# Patient Record
Sex: Female | Born: 2000
Health system: Southern US, Community
[De-identification: ages and names within clinical notes are randomized; demographics above are authoritative.]

## PROBLEM LIST (undated history)

## (undated) DIAGNOSIS — R51 Headache: Secondary | ICD-10-CM

## (undated) DIAGNOSIS — R519 Headache, unspecified: Secondary | ICD-10-CM

## (undated) DIAGNOSIS — F1910 Other psychoactive substance abuse, uncomplicated: Secondary | ICD-10-CM

## (undated) DIAGNOSIS — J40 Bronchitis, not specified as acute or chronic: Secondary | ICD-10-CM

## (undated) DIAGNOSIS — F32A Depression, unspecified: Secondary | ICD-10-CM

## (undated) DIAGNOSIS — F329 Major depressive disorder, single episode, unspecified: Secondary | ICD-10-CM

## (undated) DIAGNOSIS — F319 Bipolar disorder, unspecified: Secondary | ICD-10-CM

## (undated) DIAGNOSIS — O021 Missed abortion: Secondary | ICD-10-CM

## (undated) DIAGNOSIS — M6752 Plica syndrome, left knee: Secondary | ICD-10-CM

## (undated) DIAGNOSIS — R000 Tachycardia, unspecified: Secondary | ICD-10-CM

## (undated) DIAGNOSIS — J189 Pneumonia, unspecified organism: Secondary | ICD-10-CM

## (undated) DIAGNOSIS — F419 Anxiety disorder, unspecified: Secondary | ICD-10-CM

## (undated) HISTORY — DX: Tachycardia, unspecified: R00.0

---
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
## 2019-01-03 ENCOUNTER — Ambulatory Visit: Payer: Managed Care, Other (non HMO) | Admitting: Neurology

## 2019-01-03 ENCOUNTER — Other Ambulatory Visit: Payer: Self-pay

## 2019-01-03 ENCOUNTER — Encounter: Payer: Self-pay | Admitting: Neurology

## 2019-01-03 VITALS — BP 100/70 | HR 115 | Temp 97.7°F | Ht 65.0 in | Wt 168.6 lb

## 2019-01-03 DIAGNOSIS — R635 Abnormal weight gain: Secondary | ICD-10-CM

## 2019-01-03 DIAGNOSIS — F1911 Other psychoactive substance abuse, in remission: Secondary | ICD-10-CM | POA: Diagnosis not present

## 2019-01-03 DIAGNOSIS — G43009 Migraine without aura, not intractable, without status migrainosus: Secondary | ICD-10-CM | POA: Diagnosis not present

## 2019-01-03 MED ORDER — TOPIRAMATE 50 MG PO TABS: ORAL_TABLET | ORAL | 11 refills | Status: DC

## 2019-01-03 MED ORDER — SUMATRIPTAN SUCCINATE 100 MG PO TABS: 100.0000 mg | ORAL_TABLET | Freq: Once | ORAL | 2 refills | Status: DC | PRN

## 2019-01-03 NOTE — Progress Notes (Signed)
[facility]  PATIENT: Jenny Clark DOB: 06/16/2000  REFERRING DOCTOR OR PCP: Eveline Keto, NP (OB/GYN) and Marva Panda, NP (PCP) SOURCE:, Notes from other doctors.  _________________________________   HISTORICAL  CHIEF COMPLAINT:  Chief Complaint  Patient presents with  . New Patient (Initial Visit)    RM 13, alone. Paper referral for headaches from Eveline Keto, NP. Headaches started this past year, pain behind eyes. When headaches get worse, feels like she has to pass out.     HISTORY OF PRESENT ILLNESS:  I had the pleasure of seeing your patient, Jenny Clark, at [facility] for neurologic consultation regarding her headaches.  She is a 18 year old woman who has had migraine headaches over the last year.   They were less frequent at the beginning of the year and a few times a month during August and September and now about 2-3 days a week.      When a headache occurs, she notes pain behind the right eye. Quality of the pain is throbbing when intense superimosed on a more steady pressure like pain.  She gets nausea but no vomiting.   sometimes she is lightheaded and her body shakes.    Bright light, noises and movements intensifies the pain.    She has a milder constant pressure like pain behind the left eye that is more constant but less troublesome.    She has had blurry vision and poor focus with he migraines but no classic aura.     She has taken NSAIDs.   If she takes as soon as pain starts or when mild she has a benefit but if pain is more intense she has no benefit.   She has not tried prescribed medications.   No prophylactic medications.     Her brother gets some headaches but not typical migraines.   She had no problems with headaches when younger.      Her only medication is OCP and vitamins.   She has been on medications for bipolar disease in the past.    She has a history of substance abuse.  She notes some weight gain  over the past year.  REVIEW OF SYSTEMS: Constitutional: No fevers, chills, sweats, or change in appetite Eyes: No visual changes, double vision, eye pain Ear, nose and throat: No hearing loss, ear pain, nasal congestion, sore throat Cardiovascular: No chest pain, palpitations Respiratory: No shortness of breath at rest or with exertion.   No wheezes GastrointestinaI: No nausea, vomiting, diarrhea, abdominal pain, fecal incontinence Genitourinary: No dysuria, urinary retention or frequency.  No nocturia. Musculoskeletal: No neck pain, back pain Integumentary: No rash, pruritus, skin lesions Neurological: as above Psychiatric: She was diagnosed with bipolar disease in the past.  She feels mood issues are doing better. Endocrine: No palpitations, diaphoresis, change in appetite, change in weigh or increased thirst Hematologic/Lymphatic: No anemia, purpura, petechiae. Allergic/Immunologic: No itchy/runny eyes, nasal congestion, recent allergic reactions, rashes  ALLERGIES: No Known Allergies  HOME MEDICATIONS:  Current Outpatient Medications:  .  Norgestimate-Ethinyl Estradiol Triphasic (TRI-PREVIFEM) 0.18/0.215/0.25 MG-35 MCG tablet, Take 1 tablet by mouth daily., Disp: , Rfl:  .  SUMAtriptan (IMITREX) 100 MG tablet, Take 1 tablet (100 mg total) by mouth once as needed for up to 1 dose for migraine. May repeat in 2 hours if headache persists or recurs., Disp: 10 tablet, Rfl: 2 .  topiramate (TOPAMAX) 50 MG tablet, Take one or two at bedtime, Disp: 60 tablet, Rfl: 11  PAST MEDICAL HISTORY: Past Medical History:  Diagnosis Date  . Anxiety   . Bipolar disorder (HCC)    not fully diagnosed  . Bronchitis   . Depression   . Drug abuse (HCC)    meth, cocaine,molly, marijuana  . Headache    migraines  . Missed abortion 06/17/2017  . 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 Pneumonia    as a child    PAST SURGICAL HISTORY: Past Surgical History:  Procedure Laterality Date  .  DILATION AND EVACUATION N/A 06/19/2017   Procedure: DILATATION AND EVACUATION;  Surgeon: Sherian ReinBovard-Stuckert, Jody, MD;  Location: [facility];  Service: Gynecology;  Laterality: N/A;  . KNEE ARTHROSCOPY Left 01/23/2014   Procedure: LEFT KNEE SCOPE WITH PLICA EXCISION;  Surgeon: Thera FlakeW D Caffrey Jr., MD;  Location: [facility];  Service: Orthopedics;  Laterality: Left;  . WISDOM TOOTH EXTRACTION  01/10/2014    FAMILY HISTORY: Family History  Problem Relation Age of Onset  . Hypertension Maternal Grandfather   . Other Maternal Grandmother        cerebral amyloiod angiopathy    SOCIAL HISTORY:  Social History   Socioeconomic History  . Marital status: Single    Spouse name: Not on file  . Number of children: 0  . Years of education: 5614  . Highest education level: Not on file  Occupational History  . Occupation: [employer]  Tobacco Use  . Smoking status: Current Every Day Smoker    Packs/day: 0.50    Years: 4.00    Pack years: 2.00    Types: Cigarettes  . Smokeless tobacco: Never Used  Substance and Sexual Activity  . Alcohol use: Yes    Comment: occas  . Drug use: Not Currently    Types: Benzodiazepines, Cocaine, Amphetamines, Marijuana    Comment: denies use 04/25/18  . Sexual activity: Yes    Comment: offered condoms  Other Topics Concern  . Not on file  Social History Narrative   Lives with aunt   Caffeine use: daily   Right handed    Social Determinants of Health   Financial Resource Strain:   . Difficulty of Paying Living Expenses: Not on file  Food Insecurity:   . Worried About Programme researcher, broadcasting/film/videounning Out of Food in the Last Year: Not on file  . Ran Out of Food in the Last Year: Not on file  Transportation Needs:   . Lack of Transportation (Medical): Not on file  . Lack of Transportation (Non-Medical): Not on file  Physical Activity:   . Days of Exercise per Week: Not on file  . Minutes of Exercise per Session: Not on file  Stress:   . Feeling of Stress :  Not on file  Social Connections:   . Frequency of Communication with Friends and Family: Not on file  . Frequency of Social Gatherings with Friends and Family: Not on file  . Attends Religious Services: Not on file  . Active Member of Clubs or Organizations: Not on file  . Attends BankerClub or Organization Meetings: Not on file  . Marital Status: Not on file  Intimate Partner Violence:   . Fear of Current or Ex-Partner: Not on file  . Emotionally Abused: Not on file  . Physically Abused: Not on file  . Sexually Abused: Not on file     PHYSICAL EXAM  Vitals:   01/03/19 1021  BP: 100/70  Pulse: (!) 115  Temp: 97.7 F (36.5 C)  SpO2: 98%  Weight: 168 lb 9.6 oz (76.5 kg)  Height: 5\' 5"  (1.651 m)    Body mass index is 28.06 kg/m.   General: The patient is well-developed and well-nourished and in no acute distress  HEENT:  Head is [place]/AT.  Sclera are anicteric.  Funduscopic exam shows normal optic discs and retinal vessels.  Neck: No carotid bruits are noted.  The neck is minimally tender at the occiput.  Range of motion is normal..  Cardiovascular: The heart has a regular rate and rhythm with a normal S1 and S2. There were no murmurs, gallops or rubs.    Skin: Extremities are without rash or  edema.  Musculoskeletal:  Back is nontender  Neurologic Exam  Mental status: The patient is alert and oriented x 3 at the time of the examination. The patient has apparent normal recent and remote memory, with an apparently normal attention span and concentration ability.   Speech is normal.  Cranial nerves: Extraocular movements are full. Pupils are equal, round, and reactive to light and accomodation.  Visual fields are full.  Facial symmetry is present. There is good facial sensation to soft touch bilaterally.Facial strength is normal.  Trapezius and sternocleidomastoid strength is normal. No dysarthria is noted.  The tongue is midline, and the patient has symmetric elevation of the soft  palate. No obvious hearing deficits are noted.  Motor:  Muscle bulk is normal.   Tone is normal. Strength is  5 / 5 in all 4 extremities.   Sensory: Sensory testing is intact to pinprick, soft touch and vibration sensation in arms but reduced sensation to vibration in feet..  Coordination: Cerebellar testing reveals good finger-nose-finger and heel-to-shin bilaterally.  Gait and station: Station is normal.   Gait is normal. Tandem gait is slightly wide.   Romberg is negative.   Reflexes: Deep tendon reflexes are symmetric and normal bilaterally.   Plantar responses are flexor.    DIAGNOSTIC DATA (LABS, IMAGING, TESTING) - I reviewed patient records, labs, notes, testing and imaging myself where available.  Lab Results  Component Value Date   WBC 14.5 (H) 04/10/2018   HGB 12.5 04/10/2018   HCT 38.1 04/10/2018   MCV 90.5 04/10/2018   PLT 228 04/10/2018      Component Value Date/Time   NA 138 04/26/2018 0341   K 3.8 04/26/2018 0341   CL 104 04/26/2018 0341   CO2 24 04/26/2018 0341   GLUCOSE 111 (H) 04/26/2018 0341   BUN 13 04/26/2018 0341   CREATININE 0.68 04/26/2018 0341   CREATININE 0.73 01/04/2016 0001   CALCIUM 8.7 (L) 04/26/2018 0341   PROT 7.6 04/26/2018 0341   ALBUMIN 3.9 04/26/2018 0341   AST 21 04/26/2018 0341   ALT 17 04/26/2018 0341   ALKPHOS 91 04/26/2018 0341   BILITOT 0.2 (L) 04/26/2018 0341   GFRNONAA >60 04/26/2018 0341   GFRAA >60 04/26/2018 0341   Lab Results  Component Value Date   CHOL 174 04/12/2018   HDL 63 04/12/2018   LDLCALC 93 04/12/2018   TRIG 91 04/12/2018   CHOLHDL 2.8 04/12/2018   Lab Results  Component Value Date   HGBA1C 4.9 04/12/2018   No results found for: VITAMINB12 Lab Results  Component Value Date   TSH 1.726 04/12/2018       ASSESSMENT AND PLAN  History of substance abuse ([place])  Common migraine without intractability  Weight gain  In summary, Jenny Clark is a 18 year old woman with frequent migraine headaches  over the last year.  These are superimposed on a more constant milder pressure-like headache.  Because she is experiencing migraine pain 8 to 12 days a month, prophylactic treatment is recommended.  I discussed some options.  I will start Topamax 50 mg and have her titrate up to 100 mg if tolerated.  Of note, she had been on this a few years ago for bipolar disease and she feels that she had tolerated it well.  At that time, headaches had not been a problem.  Additionally we will start Imitrex.  We discussed limiting to no more than 10 pills a month.  She does not have any "red flag" on her examination though she does have reduced vibration sensation in the toes.  If this worsens we will need to check a nerve conduction study/EMG and some lab work.  No imaging is needed at this time.  If headaches worsen consider scanning.  If no benefit from the Topamax consider a different agent and if she fails a second oral agent we will consider one of the anti-CGRP injections.  Of note, she had been on Depakote for bipolar disease in the past but it caused side effects and was discontinued.  If she has trouble tolerating Imitrex or it is not effective consider one of the pills acting through CGRP.  She will see [name] in 3 months or sooner if there are new or worsening neurologic symptoms.  Thank you for asking me to see Jenny Clark.  Please let me know if I can be of further assistance with her or other patients in the future.    Islam Villescas A. Epimenio Foot, MD, [facility] 01/03/2019, 1:07 PM Certified in Neurology, Clinical Neurophysiology, Sleep Medicine and Neuroimaging  [facility] [address] [place], [place] [address] [phone]

## 2019-04-04 ENCOUNTER — Ambulatory Visit: Payer: Self-pay | Admitting: Family Medicine

## 2019-04-18 ENCOUNTER — Other Ambulatory Visit: Payer: Self-pay | Admitting: Neurology

## 2019-05-23 ENCOUNTER — Telehealth: Payer: Self-pay

## 2019-05-23 NOTE — Telephone Encounter (Signed)
Pt called office and left a VM requesting a call back to discuss her upcoming appt with Dr. Epimenio Foot.

## 2019-05-23 NOTE — Telephone Encounter (Signed)
I called pt. She asked to cancel her appt for 05/26/19. She will call us to reschedule. Appt has been cancelled.

## 2019-05-26 ENCOUNTER — Ambulatory Visit: Payer: Managed Care, Other (non HMO) | Admitting: Family Medicine

## 2019-10-26 ENCOUNTER — Encounter: Payer: Self-pay | Admitting: Psychiatry

## 2019-12-20 ENCOUNTER — Other Ambulatory Visit: Payer: Self-pay | Admitting: Neurology

## 2019-12-22 ENCOUNTER — Other Ambulatory Visit: Payer: Self-pay | Admitting: Neurology

## 2020-09-04 ENCOUNTER — Ambulatory Visit: Payer: Managed Care, Other (non HMO) | Admitting: Neurology

## 2020-10-08 LAB — HEPATITIS C ANTIBODY: HCV Ab: NEGATIVE

## 2020-10-08 LAB — OB RESULTS CONSOLE HIV ANTIBODY (ROUTINE TESTING): HIV: NONREACTIVE

## 2020-10-17 LAB — OB RESULTS CONSOLE ABO/RH

## 2020-11-06 DIAGNOSIS — Z3402 Encounter for supervision of normal first pregnancy, second trimester: Secondary | ICD-10-CM | POA: Diagnosis not present

## 2020-11-06 DIAGNOSIS — Z3482 Encounter for supervision of other normal pregnancy, second trimester: Secondary | ICD-10-CM | POA: Diagnosis not present

## 2020-11-06 DIAGNOSIS — Z3A13 13 weeks gestation of pregnancy: Secondary | ICD-10-CM | POA: Diagnosis not present

## 2020-11-06 DIAGNOSIS — R87612 Low grade squamous intraepithelial lesion on cytologic smear of cervix (LGSIL): Secondary | ICD-10-CM | POA: Diagnosis not present

## 2020-11-28 ENCOUNTER — Ambulatory Visit: Payer: Managed Care, Other (non HMO) | Admitting: Neurology

## 2020-12-11 ENCOUNTER — Inpatient Hospital Stay (HOSPITAL_COMMUNITY)
Admission: AD | Admit: 2020-12-11 | Discharge: 2020-12-11 | Disposition: A | Discharge status: Home / Self Care | Payer: Managed Care, Other (non HMO) | Attending: Obstetrics and Gynecology | Admitting: Obstetrics and Gynecology

## 2020-12-11 ENCOUNTER — Other Ambulatory Visit: Payer: Self-pay | Admitting: Advanced Practice Midwife

## 2020-12-11 ENCOUNTER — Other Ambulatory Visit: Payer: Self-pay

## 2020-12-11 ENCOUNTER — Encounter (HOSPITAL_COMMUNITY): Payer: Self-pay | Admitting: Obstetrics and Gynecology

## 2020-12-11 DIAGNOSIS — Z3492 Encounter for supervision of normal pregnancy, unspecified, second trimester: Secondary | ICD-10-CM

## 2020-12-11 DIAGNOSIS — O99891 Other specified diseases and conditions complicating pregnancy: Secondary | ICD-10-CM

## 2020-12-11 DIAGNOSIS — R42 Dizziness and giddiness: Secondary | ICD-10-CM | POA: Insufficient documentation

## 2020-12-11 DIAGNOSIS — F17210 Nicotine dependence, cigarettes, uncomplicated: Secondary | ICD-10-CM | POA: Insufficient documentation

## 2020-12-11 DIAGNOSIS — R000 Tachycardia, unspecified: Secondary | ICD-10-CM | POA: Diagnosis not present

## 2020-12-11 DIAGNOSIS — O99332 Smoking (tobacco) complicating pregnancy, second trimester: Secondary | ICD-10-CM | POA: Insufficient documentation

## 2020-12-11 DIAGNOSIS — O26892 Other specified pregnancy related conditions, second trimester: Secondary | ICD-10-CM | POA: Diagnosis present

## 2020-12-11 DIAGNOSIS — R55 Syncope and collapse: Secondary | ICD-10-CM | POA: Diagnosis not present

## 2020-12-11 DIAGNOSIS — Z3A18 18 weeks gestation of pregnancy: Secondary | ICD-10-CM | POA: Diagnosis not present

## 2020-12-11 LAB — RAPID URINE DRUG SCREEN, HOSP PERFORMED
Amphetamines: NOT DETECTED
Barbiturates: NOT DETECTED
Benzodiazepines: NOT DETECTED
Cocaine: NOT DETECTED
Opiates: NOT DETECTED
Tetrahydrocannabinol: NOT DETECTED

## 2020-12-11 LAB — COMPREHENSIVE METABOLIC PANEL
ALT: 22 U/L (ref 0–44)
AST: 20 U/L (ref 15–41)
Albumin: 3.2 g/dL — ABNORMAL LOW (ref 3.5–5.0)
Alkaline Phosphatase: 73 U/L (ref 38–126)
Anion gap: 10 (ref 5–15)
BUN: 6 mg/dL (ref 6–20)
CO2: 22 mmol/L (ref 22–32)
Calcium: 9.4 mg/dL (ref 8.9–10.3)
Chloride: 103 mmol/L (ref 98–111)
Creatinine, Ser: 0.52 mg/dL (ref 0.44–1.00)
GFR, Estimated: >60 mL/min (ref >60)
Glucose, Bld: 74 mg/dL (ref 70–99)
Potassium: 3.8 mmol/L (ref 3.5–5.1)
Sodium: 135 mmol/L (ref 135–145)
Total Bilirubin: 0.2 mg/dL — ABNORMAL LOW (ref 0.3–1.2)
Total Protein: 7.3 g/dL (ref 6.5–8.1)

## 2020-12-11 LAB — CBC
HCT: 36.9 % (ref 36.0–46.0)
Hemoglobin: 12.3 g/dL (ref 12.0–15.0)
MCH: 28.5 pg (ref 26.0–34.0)
MCHC: 33.3 g/dL (ref 30.0–36.0)
MCV: 85.6 fL (ref 80.0–100.0)
Platelets: 251 10*3/uL (ref 150–400)
RBC: 4.31 MIL/uL (ref 3.87–5.11)
RDW: 13.3 % (ref 11.5–15.5)
WBC: 13.4 10*3/uL — ABNORMAL HIGH (ref 4.0–10.5)
nRBC: 0 % (ref 0.0–0.2)

## 2020-12-11 LAB — URINALYSIS, ROUTINE W REFLEX MICROSCOPIC
Bilirubin Urine: NEGATIVE
Glucose, UA: NEGATIVE mg/dL
Hgb urine dipstick: NEGATIVE
Ketones, ur: NEGATIVE mg/dL
Leukocytes,Ua: NEGATIVE
Nitrite: NEGATIVE
Protein, ur: NEGATIVE mg/dL
Specific Gravity, Urine: >1.03 — ABNORMAL HIGH (ref 1.005–1.030)
pH: 6 (ref 5.0–8.0)

## 2020-12-11 NOTE — Progress Notes (Signed)
Referral to [facility] as indicated by recurrent syncope.  Clayton Bibles, MSA, MSN, CNM Certified Nurse Midwife, Biochemist, clinical for [employer], [facility]

## 2020-12-11 NOTE — Progress Notes (Signed)
Sam Weinhold CNM in earlier to discuss test results and d/c plan. Written and verbal d/c instructions given and understanding voiced. 

## 2020-12-11 NOTE — MAU Provider Note (Addendum)
History     CSN: [id number]  Arrival date and time: 12/11/20 1752   Event Date/Time   First Provider Initiated Contact with Patient 12/11/20 2031      Chief Complaint  Patient presents with   visual changes   Tachycardia   Dizziness   HPI Jenny Clark is a 20 y.o. G2P0 at [redacted]w[redacted]d who presents to MAU with chief complaints of near syncope, tachycardia, activity intolerance and visual changes. These are recurrent problems which predate pregnancy. 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 This occurred immediately after vomiting. She states prior to this episode she lost control of her eyelid. She endorses "seeing stars" and feels as if her "heart is beating out of my chest". She experienced a similar episode yesterday and her symptoms resolved with rest. Patient is asymptomatic on arrival to MAU. 12 hour diet recall is significant for cereal and [employer].   Patient endorses history of migraines. She states that when her migraines were severe she would experience the inability to control one of her eyes, dizziness and syncope. She endorses ED evaluation of these symptoms but has not been evaluated by Cardiology and has never worn a cardiac monitor. She reports multiple episodes of today's symptoms in first trimester but states that period of her pregnancy was complicated by severe vomiting. Her syncopal and near-syncopal events in first trimester occurred in the setting of strong smells, patient care involving lifting (e.g. moving patient to [facility] lift) or bathing a patient in a hot room.  Patient receives prenatal care with [facility].  OB History     Gravida  2   Para      Term      Preterm      AB      Living         SAB      IAB      Ectopic      Multiple      Live Births              Past Medical History:  Diagnosis Date   Anxiety    Bipolar disorder (HCC)    not fully diagnosed   Bronchitis    Depression    Drug  abuse (HCC)    meth, cocaine,molly, marijuana   Headache    migraines   Missed abortion 06/17/2017   Plica syndrome of left knee 01/2014   Pneumonia    as a child    Past Surgical History:  Procedure Laterality Date   DILATION AND EVACUATION N/A 06/19/2017   Procedure: DILATATION AND EVACUATION;  Surgeon: Sherian Rein, MD;  Location: [facility];  Service: Gynecology;  Laterality: N/A;   KNEE ARTHROSCOPY Left 01/23/2014   Procedure: LEFT KNEE SCOPE WITH PLICA EXCISION;  Surgeon: Thera Flake., MD;  Location: [facility];  Service: Orthopedics;  Laterality: Left;   WISDOM TOOTH EXTRACTION  01/10/2014    Family History  Problem Relation Age of Onset   Hypertension Maternal Grandfather    Other Maternal Grandmother        cerebral amyloiod angiopathy    Social History   Tobacco Use   Smoking status: Every Day    Packs/day: 0.50    Years: 4.00    Pack years: 2.00    Types: Cigarettes   Smokeless tobacco: Never  Vaping Use   Vaping Use: Never used  Substance Use Topics   Alcohol use: Yes  Comment: occas   Drug use: Not Currently    Types: Benzodiazepines, Cocaine, Amphetamines, Marijuana    Comment: denies use 04/25/18    Allergies: No Known Allergies  Medications Prior to Admission  Medication Sig Dispense Refill Last Dose   Norgestimate-Ethinyl Estradiol Triphasic (TRI-PREVIFEM) 0.18/0.215/0.25 MG-35 MCG tablet Take 1 tablet by mouth daily.      SUMAtriptan (IMITREX) 100 MG tablet TAKE 1 TAB ONCE AS NEEDED FOR UP TO 1 DOSE FOR MIGRAINE. MAY REPEAT IN 2HRS IF HEADACHE PERSISTS 9 tablet 0    topiramate (TOPAMAX) 50 MG tablet Take one or two at bedtime 60 tablet 11     Review of Systems  Eyes:  Negative for photophobia and visual disturbance.  Respiratory:  Negative for chest tightness.   Cardiovascular:  Negative for palpitations.  Gastrointestinal:  Negative for abdominal pain.  Neurological:  Negative for dizziness and headaches.  All other  systems reviewed and are negative. Physical Exam   Blood pressure 123/67, pulse (!) 118, temperature 98.1 F (36.7 C), temperature source Oral, resp. rate 18, height 5\' 5"  (1.651 m), weight 86.8 kg, SpO2 99 %, unknown if currently breastfeeding.  Physical Exam Vitals and nursing note reviewed. Exam conducted with a chaperone present.  Constitutional:      General: She is not in acute distress.    Appearance: Normal appearance. She is not ill-appearing.  Cardiovascular:     Rate and Rhythm: Normal rate and regular rhythm.     Pulses: Normal pulses.     Heart sounds: Normal heart sounds.  Pulmonary:     Effort: Pulmonary effort is normal.     Breath sounds: Normal breath sounds.  Abdominal:     Palpations: Abdomen is soft.  Skin:    General: Skin is warm and dry.     Capillary Refill: Capillary refill takes less than 2 seconds.  Neurological:     Mental Status: She is alert and oriented to person, place, and time.     Cranial Nerves: Cranial nerves 2-12 are intact.     Sensory: Sensation is intact.     Motor: Motor function is intact.     Coordination: Coordination is intact.     Gait: Gait is intact.  Psychiatric:        Mood and Affect: Mood normal.        Behavior: Behavior normal.        Thought Content: Thought content normal.        Judgment: Judgment normal.    MAU Course  Procedures  --EMR reviewed. Identical symptoms. Previous evaluation of symptoms from 04/2018 occurred in setting of active polysubstance abuse. Patient now sober, endorsing decreased migraine occurrence, previous episodes which occurred during pregnancy all had environmental trigger e.g. heavy lifting, hot bathroom --Patient asymptomatic throughout period of evaluation in MAU --Intact Neuro exam   Patient Vitals for the past 24 hrs:  BP Temp Temp src Pulse Resp SpO2 Height Weight  12/11/20 2042 111/63 -- -- 94 -- -- -- --  12/11/20 1945 123/67 -- -- (!) 118 -- -- -- --  12/11/20 1942 109/66 --  -- (!) 117 -- -- -- --  12/11/20 1940 113/64 -- -- 95 -- -- -- --  12/11/20 1938 113/60 -- -- 93 -- -- -- --  12/11/20 1824 113/66 98.1 F (36.7 C) Oral 96 18 99 % 5\' 5"  (1.651 m) 86.8 kg   Results for orders placed or performed during the hospital encounter of 12/11/20 (from the past 24  hour(s))  Urinalysis, Routine w reflex microscopic Urine, Clean Catch     Status: Abnormal   Collection Time: 12/11/20  7:00 PM  Result Value Ref Range   Color, Urine YELLOW YELLOW   APPearance HAZY (A) CLEAR   Specific Gravity, Urine >1.030 (H) 1.005 - 1.030   pH 6.0 5.0 - 8.0   Glucose, UA NEGATIVE NEGATIVE mg/dL   Hgb urine dipstick NEGATIVE NEGATIVE   Bilirubin Urine NEGATIVE NEGATIVE   Ketones, ur NEGATIVE NEGATIVE mg/dL   Protein, ur NEGATIVE NEGATIVE mg/dL   Nitrite NEGATIVE NEGATIVE   Leukocytes,Ua NEGATIVE NEGATIVE  Rapid urine drug screen (hospital performed)     Status: None   Collection Time: 12/11/20  7:00 PM  Result Value Ref Range   Opiates NONE DETECTED NONE DETECTED   Cocaine NONE DETECTED NONE DETECTED   Benzodiazepines NONE DETECTED NONE DETECTED   Amphetamines NONE DETECTED NONE DETECTED   Tetrahydrocannabinol NONE DETECTED NONE DETECTED   Barbiturates NONE DETECTED NONE DETECTED  CBC     Status: Abnormal   Collection Time: 12/11/20  7:08 PM  Result Value Ref Range   WBC 13.4 (H) 4.0 - 10.5 K/uL   RBC 4.31 3.87 - 5.11 MIL/uL   Hemoglobin 12.3 12.0 - 15.0 g/dL   HCT 36.9 36.0 - 46.0 %   MCV 85.6 80.0 - 100.0 fL   MCH 28.5 26.0 - 34.0 pg   MCHC 33.3 30.0 - 36.0 g/dL   RDW 13.3 11.5 - 15.5 %   Platelets 251 150 - 400 K/uL   nRBC 0.0 0.0 - 0.2 %  Comprehensive metabolic panel     Status: Abnormal   Collection Time: 12/11/20  7:08 PM  Result Value Ref Range   Sodium 135 135 - 145 mmol/L   Potassium 3.8 3.5 - 5.1 mmol/L   Chloride 103 98 - 111 mmol/L   CO2 22 22 - 32 mmol/L   Glucose, Bld 74 70 - 99 mg/dL   BUN 6 6 - 20 mg/dL   Creatinine, Ser 0.52 0.44 - 1.00 mg/dL    Calcium 9.4 8.9 - 10.3 mg/dL   Total Protein 7.3 6.5 - 8.1 g/dL   Albumin 3.2 (L) 3.5 - 5.0 g/dL   AST 20 15 - 41 U/L   ALT 22 0 - 44 U/L   Alkaline Phosphatase 73 38 - 126 U/L   Total Bilirubin 0.2 (L) 0.3 - 1.2 mg/dL   GFR, Estimated >60 >60 mL/min   Anion gap 10 5 - 15   Assessment and Plan  --20 y.o. G2P0 at [redacted]w[redacted]d  --FHT 155 by Doppler --Normal ECG in MAU --Stable orthostatic vital signs --Symptoms consistent with vasovagal syncope --No acute findings during evaluation in MAU --Discharge home in stable condition  F/U: Amb referral placed for Cardio Obstetrics team  Darlina Rumpf, CNM 12/11/2020, 9:26 PM

## 2020-12-11 NOTE — MAU Note (Signed)
Dr sent her over, passed out today(this morning)- saw stars, then she felt sick and she threw up. Right before she passed out, her rt eyelid was shut and she couldn't control it.  Been seeing stars all day and her heart has been beating out of her chest off and on.  Her BP and pulse was normal at the dr's office.   Has felt light headed off and on. Yesterday felt similar, was at home- was able to lay and things passed.

## 2020-12-20 ENCOUNTER — Inpatient Hospital Stay (HOSPITAL_COMMUNITY)
Admission: AD | Admit: 2020-12-20 | Discharge: 2020-12-20 | Disposition: A | Discharge status: Home / Self Care | Payer: Managed Care, Other (non HMO) | Attending: Obstetrics | Admitting: Obstetrics

## 2020-12-20 ENCOUNTER — Other Ambulatory Visit: Payer: Self-pay

## 2020-12-20 ENCOUNTER — Inpatient Hospital Stay (HOSPITAL_COMMUNITY): Payer: Managed Care, Other (non HMO)

## 2020-12-20 ENCOUNTER — Encounter (HOSPITAL_COMMUNITY): Payer: Self-pay | Admitting: Obstetrics

## 2020-12-20 DIAGNOSIS — R0602 Shortness of breath: Secondary | ICD-10-CM | POA: Insufficient documentation

## 2020-12-20 DIAGNOSIS — J984 Other disorders of lung: Secondary | ICD-10-CM | POA: Diagnosis not present

## 2020-12-20 DIAGNOSIS — Z3A19 19 weeks gestation of pregnancy: Secondary | ICD-10-CM | POA: Diagnosis not present

## 2020-12-20 DIAGNOSIS — Z87891 Personal history of nicotine dependence: Secondary | ICD-10-CM | POA: Diagnosis not present

## 2020-12-20 DIAGNOSIS — R059 Cough, unspecified: Secondary | ICD-10-CM | POA: Diagnosis not present

## 2020-12-20 DIAGNOSIS — R079 Chest pain, unspecified: Secondary | ICD-10-CM | POA: Diagnosis not present

## 2020-12-20 DIAGNOSIS — O26892 Other specified pregnancy related conditions, second trimester: Secondary | ICD-10-CM | POA: Diagnosis not present

## 2020-12-20 DIAGNOSIS — Z0389 Encounter for observation for other suspected diseases and conditions ruled out: Secondary | ICD-10-CM | POA: Diagnosis not present

## 2020-12-20 LAB — URINALYSIS, ROUTINE W REFLEX MICROSCOPIC
Bilirubin Urine: NEGATIVE
Glucose, UA: NEGATIVE mg/dL
Hgb urine dipstick: NEGATIVE
Ketones, ur: 40 mg/dL — AB
Leukocytes,Ua: NEGATIVE
Nitrite: NEGATIVE
Protein, ur: NEGATIVE mg/dL
Specific Gravity, Urine: 1.02 (ref 1.005–1.030)
pH: 6.5 (ref 5.0–8.0)

## 2020-12-20 MED ORDER — AZITHROMYCIN 250 MG PO TABS
500.0000 mg | ORAL_TABLET | Freq: Once | ORAL | Status: AC
  Administered 2020-12-20: 500 mg via ORAL
  Filled 2020-12-20: qty 2

## 2020-12-20 MED ORDER — AZITHROMYCIN 250 MG PO TABS: ORAL_TABLET | ORAL | 0 refills | Status: DC

## 2020-12-20 MED ORDER — AMOXICILLIN-POT CLAVULANATE 875-125 MG PO TABS: 1.0000 | ORAL_TABLET | Freq: Two times a day (BID) | ORAL | 0 refills | Status: DC

## 2020-12-20 NOTE — MAU Provider Note (Signed)
History     CSN: [id number]  Arrival date and time: 12/20/20 1149   None     Chief Complaint  Patient presents with   Cough   Shortness of Breath   Jenny Clark is a 20 y.o. G3P0010 at [redacted]w[redacted]d who receives care at [facility].  She presents today for Cough and Shortness of Breath.  She states she has been sick since Friday and was told on Tuesday that she had bronchitis.  She states last night she was having issues with catching her breath and some feelings of syncope.  She states she woke up this morning with chest pain and discomfort d/t the coughing.  She reports the coughing has improved since arrival.  She reports she is taking only prenatal vitamins and admits she has a prescription for an albuterol inhaler, but feels it doesn't help.  She states she used the inhaler yesterday around 2030.  She denies vaginal concerns including bleeding and discharge. She also denies abdominal pain or cramping, but states she does experience some cramping with increased coughing.  Patient denies a history of asthma. She endorses influenza and covid vaccine.  She reports having Covid and influenza testing that returned negative.    OB History     Gravida  3   Para      Term      Preterm      AB  1   Living  0      SAB  1   IAB      Ectopic      Multiple      Live Births              Past Medical History:  Diagnosis Date   Anxiety    Bipolar disorder (HCC)    not fully diagnosed   Bronchitis    Depression    Drug abuse (HCC)    meth, cocaine,molly, marijuana   Headache    migraines   Missed abortion 06/17/2017   Plica syndrome of left knee 01/2014   Pneumonia    as a child    Past Surgical History:  Procedure Laterality Date   DILATION AND EVACUATION N/A 06/19/2017   Procedure: DILATATION AND EVACUATION;  Surgeon: Sherian Rein, MD;  Location: [facility];  Service: Gynecology;  Laterality: N/A;   KNEE ARTHROSCOPY Left 01/23/2014   Procedure: LEFT KNEE  SCOPE WITH PLICA EXCISION;  Surgeon: Thera Flake., MD;  Location: [facility];  Service: Orthopedics;  Laterality: Left;   WISDOM TOOTH EXTRACTION  01/10/2014    Family History  Problem Relation Age of Onset   Hypertension Maternal Grandfather    Other Maternal Grandmother        cerebral amyloiod angiopathy    Social History   Tobacco Use   Smoking status: Former    Packs/day: 0.50    Years: 4.00    Pack years: 2.00    Types: Cigarettes   Smokeless tobacco: Never  Vaping Use   Vaping Use: Never used  Substance Use Topics   Alcohol use: Not Currently    Comment: occas   Drug use: Not Currently    Types: Benzodiazepines, Cocaine, Amphetamines, Marijuana    Comment: denies use 04/25/18    Allergies: No Known Allergies  Medications Prior to Admission  Medication Sig Dispense Refill Last Dose   Prenatal Vit-Fe Fumarate-FA (MULTIVITAMIN-PRENATAL) 27-0.8 MG TABS tablet Take 1 tablet by mouth daily at 12 noon.   12/18/2020  SUMAtriptan (IMITREX) 100 MG tablet TAKE 1 TAB ONCE AS NEEDED FOR UP TO 1 DOSE FOR MIGRAINE. MAY REPEAT IN 2HRS IF HEADACHE PERSISTS 9 tablet 0     Review of Systems  Constitutional:  Negative for chills and fever.  Respiratory:  Positive for cough, chest tightness ("Tight burn") and shortness of breath.   Cardiovascular:  Positive for chest pain.  Gastrointestinal:  Positive for nausea. Negative for abdominal pain and vomiting.  Neurological:  Positive for dizziness (With SOB) and headaches.  Physical Exam   Blood pressure 110/63, pulse (!) 110, temperature 98 F (36.7 C), temperature source Oral, resp. rate 18, height 5\' 5"  (1.651 m), weight 86.2 kg, SpO2 99 %, unknown if currently breastfeeding.  Physical Exam Constitutional:      General: She is not in acute distress.    Appearance: She is well-developed.  HENT:     Head: Normocephalic and atraumatic.  Cardiovascular:     Rate and Rhythm: Tachycardia present.  Pulmonary:      Breath sounds: Examination of the right-lower field reveals wheezing. Examination of the left-lower field reveals wheezing. Wheezing present.  Musculoskeletal:     Cervical back: Normal range of motion.  Skin:    General: Skin is warm and dry.  Neurological:     Mental Status: She is alert and oriented to person, place, and time.  Psychiatric:        Mood and Affect: Mood normal.        Behavior: Behavior normal.    MAU Course  Procedures Results for orders placed or performed during the hospital encounter of 12/20/20 (from the past 24 hour(s))  Urinalysis, Routine w reflex microscopic Urine, Clean Catch     Status: Abnormal   Collection Time: 12/20/20 12:29 PM  Result Value Ref Range   Color, Urine YELLOW YELLOW   APPearance CLEAR CLEAR   Specific Gravity, Urine 1.020 1.005 - 1.030   pH 6.5 5.0 - 8.0   Glucose, UA NEGATIVE NEGATIVE mg/dL   Hgb urine dipstick NEGATIVE NEGATIVE   Bilirubin Urine NEGATIVE NEGATIVE   Ketones, ur 40 (A) NEGATIVE mg/dL   Protein, ur NEGATIVE NEGATIVE mg/dL   Nitrite NEGATIVE NEGATIVE   Leukocytes,Ua NEGATIVE NEGATIVE   DG Chest 1 View  Result Date: 12/20/2020 CLINICAL DATA:  Shortness of breath, cough, and congestion. EXAM: CHEST  1 VIEW COMPARISON:  11/14/2014 FINDINGS: The cardiomediastinal silhouette is within normal limits. Ill-defined densities are present in the right mid lung and possibly in both lower lungs. The right mid lung density appears partly nodular. No sizable pleural effusion or pneumothorax is identified. No acute osseous abnormality is seen. IMPRESSION: Ill-defined right mid lung and possibly bilateral lower lung opacities suspicious for infection with this history. Given the nodular appearance of the right mid lung density, follow-up PA and lateral chest radiographs are recommended in 3-4 weeks following treatment to ensure resolution. Electronically Signed   By: 13/08/2014 M.D.   On: 12/20/2020 13:21    MDM Chest X  Ray Consult Antibiotics Assessment and Plan  20 year old, G3P0010  SIUP at 19.6 weeks Chest Pain s/t Cough SOB s/t Cough  -Reviewed POC with patient. -Exam performed and findings discussed.  -Informed that Chest XRay will be performed. -Will consider nebulizer treatment if SOB worsens. -Patient agreeable and without questions. -Will await results.   36 12/20/2020, 12:41 PM    Reassessment (2:10 PM)  -Results return as above. -Dr. 12/22/2020. Anyanwu consulted and informed of patient  status, evaluation, interventions, and results. Advised: *Give antibiotics for suspected lung infection. *Have patient follow up with primary office for repeat scan as recommended. -Provider to bedside to discuss findings, recommendation including treatment, and follow up. -Questions regarding work addressed. Informed that she should be okay to work on Saturday which is 48 hours s/p antibiotic initiation. -Will send in script for Augmentin and Zithromax. -Patient questions safety of diflucan if yeast infection arises.  Informed that single dose should be with limited risks. -Precautions given. -Encouraged to call primary office or return to MAU if symptoms worsen or with the onset of new symptoms. -Discharged to home in stable condition.  Cherre Robins MSN, CNM Advanced Practice Provider, [facility] [employer]

## 2020-12-20 NOTE — MAU Note (Signed)
Presents stating she was diagnosed with bronchitis on Tuesday but reports she's SOB, coughing and feels like she can't catch her breath. Denies pregnancy related issue, no VB or LOF.

## 2021-01-06 NOTE — L&D Delivery Note (Signed)
Delivery Note ? ? ?Pt progressed to complete dilation and pushed very well for about 30 minutes.  At 11:25 PM a healthy female was delivered via Vaginal, Spontaneous (Presentation: Left Occiput Anterior).  APGAR: 8, 9; weight pending .   ?Placenta status: Spontaneous, Intact.  Cord: 3 vessels with the following complications: Nuchal cord x 1 reduced. ? ?Anesthesia: Epidural ?Episiotomy: None ?Lacerations: Labial abrasions repaired for hemostasis ?Suture Repair: 3.0 vicryl rapide ?Est. Blood Loss (mL): 50 ? ?Mom to postpartum.  Baby to Couplet care / Skin to Skin. ? ?[name] ?05/03/2021, 11:50 PM ? ? ? ?

## 2021-01-25 ENCOUNTER — Ambulatory Visit (INDEPENDENT_AMBULATORY_CARE_PROVIDER_SITE_OTHER): Payer: Managed Care, Other (non HMO) | Admitting: Cardiology

## 2021-01-25 ENCOUNTER — Other Ambulatory Visit: Payer: Self-pay

## 2021-01-25 ENCOUNTER — Encounter: Payer: Self-pay | Admitting: Cardiology

## 2021-01-25 VITALS — BP 120/64 | HR 105 | Ht 65.0 in | Wt 193.9 lb

## 2021-01-25 DIAGNOSIS — R55 Syncope and collapse: Secondary | ICD-10-CM

## 2021-01-25 DIAGNOSIS — R000 Tachycardia, unspecified: Secondary | ICD-10-CM

## 2021-01-25 NOTE — Patient Instructions (Addendum)
Medication Instructions:  Your physician recommends that you continue on your current medications as directed. Please refer to the Current Medication list given to you today.  *If you need a refill on your cardiac medications before your next appointment, please call your pharmacy*  Lab Work: NONE ordered at this time of appointment   If you have labs (blood work) drawn today and your tests are completely normal, you will receive your results only by: MyChart Message (if you have MyChart) OR A paper copy in the mail If you have any lab test that is abnormal or we need to change your treatment, we will call you to review the results.  Testing/Procedures: Your physician has requested that you have an echocardiogram. Echocardiography is a painless test that uses sound waves to create images of your heart. It provides your doctor with information about the size and shape of your heart and how well your hearts chambers and valves are working. This procedure takes approximately one hour. There are no restrictions for this procedure.  Cardiac-OB patient: to be performed by [place] or [place].    Follow-Up: At [facility], you and your health needs are our priority.  As part of our continuing mission to provide you with exceptional heart care, we have created designated Provider Care Teams.  These Care Teams include your primary Cardiologist (physician) and Advanced Practice Providers (APPs -  Physician Assistants and Nurse Practitioners) who all work together to provide you with the care you need, when you need it.  Your next appointment:   12 week(s)  The format for your next appointment:   In Person  Provider:   Thomasene Ripple, DO    Other Instructions Use Apple watch to record EKG and send report to Dr. Servando Salina. If symptoms worsen please give our office a call at [phone]. You may call [phone] to scheduled appointment with [facility]

## 2021-01-25 NOTE — Progress Notes (Signed)
Cardio-Obstetrics Clinic  New Evaluation  Date:  01/25/2021   ID:  Jenny Clark, DOB December 14, 2000, MRN [id number]  PCP:  Marva Panda, NP   [facility] Providers Cardiologist:  Thomasene Ripple, DO  Electrophysiologist:  None       Referring MD: Joannie Springs*   Chief Complaint: Tachycardia, Syncope  History of Present Illness:    Jenny Clark is a 21 y.o. female [G3P0010] who is being seen today for the evaluation of tachycardia and syncope at the request of Calvert Cantor, C*.   Patient reports feeling past heart rates progressively years.  Since she has been pregnant, she feels like it is increasing in frequency.  These episodes happen daily and at random times.  They occur both at rest and not with exertion.  Occasionally she will have some shortness of breath with these episodes.  They typically last between seconds to minutes.  She shares that about an episode in December where she was at her job orientation and passed out.  Prior to her syncopal episode she felt pressure in her head, was seeing black spots, so she needed to sit down.  She is had 4-5 syncopal episodes in her life.   She denies any family history of sudden cardiac death.  Notes that her paternal grandfather has atrial fibrillation sleep apnea.  Her paternal grandmother has thyroid disease and breast cancer.  She denies any increased swelling, PND, chest pain.    Prior CV Studies Reviewed: The following studies were reviewed today: CXR 14-Jan-2021: Ill-defined right mid lung and possibly bilateral lower lung opacities suspicious for infection with this history. Given the nodular appearance of the right mid lung density, follow-up PA and lateral chest radiographs are recommended in 3-4 weeks following treatment to ensure resolution.  Past Medical History:  Diagnosis Date   Anxiety    Bipolar disorder (HCC)    not fully diagnosed   Bronchitis    Depression    Drug abuse (HCC)     meth, cocaine,molly, marijuana   Headache    migraines   Missed abortion 06/17/2017   Plica syndrome of left knee 01/2014   Pneumonia    as a child    Past Surgical History:  Procedure Laterality Date   DILATION AND EVACUATION N/A 06/19/2017   Procedure: DILATATION AND EVACUATION;  Surgeon: Sherian Rein, MD;  Location: [facility];  Service: Gynecology;  Laterality: N/A;   KNEE ARTHROSCOPY Left 01/23/2014   Procedure: LEFT KNEE SCOPE WITH PLICA EXCISION;  Surgeon: Thera Flake., MD;  Location: [facility];  Service: Orthopedics;  Laterality: Left;   WISDOM TOOTH EXTRACTION  01/10/2014      G2P0010; had miscarriage at the age of 89.    Current Medications: Current Meds  Medication Sig   Prenatal Vit-Fe Fumarate-FA (MULTIVITAMIN-PRENATAL) 27-0.8 MG TABS tablet Take 1 tablet by mouth daily at 12 noon.     Allergies:   Patient has no known allergies.   Social History   Socioeconomic History   Marital status: Single    Spouse name: Not on file   Number of children: 0   Years of education: 14   Highest education level: Not on file  Occupational History   Occupation: [employer]  Tobacco Use   Smoking status: Former    Packs/day: 0.50    Years: 4.00    Pack years: 2.00    Types: Cigarettes   Smokeless tobacco: Never  Vaping Use  Vaping Use: Never used  Substance and Sexual Activity   Alcohol use: Not Currently    Comment: occas   Drug use: Not Currently    Types: Benzodiazepines, Cocaine, Amphetamines, Marijuana    Comment: denies use 04/25/18   Sexual activity: Yes    Comment: offered condoms  Other Topics Concern   Not on file  Social History Narrative   Lives with aunt   Caffeine use: daily   Right handed    Social Determinants of Health   Financial Resource Strain: Not on file  Food Insecurity: Not on file  Transportation Needs: Not on file  Physical Activity: Not on file  Stress: Not on file  Social Connections: Not on  file      Family History  Problem Relation Age of Onset   Hypertension Maternal Grandfather    Other Maternal Grandmother        cerebral amyloiod angiopathy      ROS:   Please see the history of present illness.    All other systems reviewed and are negative.   Labs/EKG Reviewed:    EKG:   EKG is ordered today.  The ekg ordered today demonstrates sinus tachycardia, rate 105 without ST changes.  Recent Labs: 12/11/2020: ALT 22; BUN 6; Creatinine, Ser 0.52; Hemoglobin 12.3; Platelets 251; Potassium 3.8; Sodium 135   Recent Lipid Panel Lab Results  Component Value Date/Time   CHOL 174 04/12/2018 06:32 AM   TRIG 91 04/12/2018 06:32 AM   HDL 63 04/12/2018 06:32 AM   CHOLHDL 2.8 04/12/2018 06:32 AM   LDLCALC 93 04/12/2018 06:32 AM    Physical Exam:    VS:  BP 120/64    Pulse (!) 105    Ht 5\' 5"  (1.651 m)    Wt 193 lb 14.4 oz (88 kg)    SpO2 97%    BMI 32.27 kg/m     Wt Readings from Last 3 Encounters:  01/25/21 193 lb 14.4 oz (88 kg)  12/20/20 190 lb 1.6 oz (86.2 kg)  12/11/20 191 lb 4.8 oz (86.8 kg)    GEN:  Well nourished, well developed in no acute distress HEENT: Normal NECK: No JVD; No carotid bruits CARDIAC: Sinus tachycardia, no murmurs, rubs, gallops RESPIRATORY:  Clear to auscultation without rales, wheezing or rhonchi  ABDOMEN: Soft, non-tender, non-distended MUSCULOSKELETAL:  No edema; No deformity  SKIN: Warm and dry NEUROLOGIC:  Alert and oriented x 3 PSYCHIATRIC:  Normal affect   Risk Assessment/Risk Calculators:     CARPREG II Risk Prediction Index Score:  1.  The patient's risk for a primary cardiac event is 5%.   Modified World Health Organization [facility]) Classification of Maternal CV Risk   Class I         ASSESSMENT & PLAN:    Syncope Must exclude structural causes. Doesn't appear to have Fhx of cardiomyopathy. Had predromal symptoms, slightly reassuring. Could be vaso-vagal in etiology but will need further  assessment. -Echo -Encouraged hydration -Return sooner if recurs   Tachycardia  Could be worsened by dehydration given her recurrent N/V, though this has been improved. Also could be normal in pregnancy.  -Monitor EKG on Apple watch; patient to send in readings -Return in 12 weeks, sooner if worsening symptoms   Patient Instructions  Medication Instructions:  Your physician recommends that you continue on your current medications as directed. Please refer to the Current Medication list given to you today.  *If you need a refill on your cardiac medications before your  next appointment, please call your pharmacy*  Lab Work: NONE ordered at this time of appointment   If you have labs (blood work) drawn today and your tests are completely normal, you will receive your results only by: MyChart Message (if you have MyChart) OR A paper copy in the mail If you have any lab test that is abnormal or we need to change your treatment, we will call you to review the results.  Testing/Procedures: Your physician has requested that you have an echocardiogram. Echocardiography is a painless test that uses sound waves to create images of your heart. It provides your doctor with information about the size and shape of your heart and how well your hearts chambers and valves are working. This procedure takes approximately one hour. There are no restrictions for this procedure.  Cardiac-OB patient: to be performed by [place] or [place].    Follow-Up: At [facility], you and your health needs are our priority.  As part of our continuing mission to provide you with exceptional heart care, we have created designated Provider Care Teams.  These Care Teams include your primary Cardiologist (physician) and Advanced Practice Providers (APPs -  Physician Assistants and Nurse Practitioners) who all work together to provide you with the care you need, when you need it.  Your next appointment:   12  week(s)  The format for your next appointment:   In Person  Provider:   Thomasene RippleKardie Tobb, DO    Other Instructions Use Apple watch to record EKG and send report to Dr. Servando Salinaobb. If symptoms worsen please give our office a call at [phone]. You may call [phone] to scheduled appointment with [facility]   Dispo:  Return in about 12 weeks (around 04/19/2021).   Medication Adjustments/Labs and Tests Ordered: Current medicines are reviewed at length with the patient today.  Concerns regarding medicines are outlined above.  Tests Ordered: Orders Placed This Encounter  Procedures   EKG 12-Lead   ECHOCARDIOGRAM COMPLETE   Medication Changes: No orders of the defined types were placed in this encounter.

## 2021-01-26 ENCOUNTER — Encounter: Payer: Self-pay | Admitting: Cardiology

## 2021-01-30 ENCOUNTER — Other Ambulatory Visit: Payer: Self-pay

## 2021-01-30 MED ORDER — PROPRANOLOL HCL 10 MG PO TABS: 10.0000 mg | ORAL_TABLET | Freq: Two times a day (BID) | ORAL | 3 refills | Status: DC

## 2021-01-30 NOTE — Telephone Encounter (Signed)
Called pt. Prescription sent to pharmacy. Set up with follow up on 2/23.

## 2021-02-05 ENCOUNTER — Inpatient Hospital Stay (HOSPITAL_BASED_OUTPATIENT_CLINIC_OR_DEPARTMENT_OTHER): Payer: PRIVATE HEALTH INSURANCE

## 2021-02-05 ENCOUNTER — Encounter (HOSPITAL_COMMUNITY): Payer: Self-pay | Admitting: Obstetrics and Gynecology

## 2021-02-05 ENCOUNTER — Other Ambulatory Visit: Payer: Self-pay

## 2021-02-05 ENCOUNTER — Inpatient Hospital Stay (HOSPITAL_COMMUNITY)
Admission: AD | Admit: 2021-02-05 | Discharge: 2021-02-05 | Disposition: A | Discharge status: Home / Self Care | Payer: PRIVATE HEALTH INSURANCE | Attending: Obstetrics and Gynecology | Admitting: Obstetrics and Gynecology

## 2021-02-05 DIAGNOSIS — Z3A26 26 weeks gestation of pregnancy: Secondary | ICD-10-CM

## 2021-02-05 DIAGNOSIS — O26899 Other specified pregnancy related conditions, unspecified trimester: Secondary | ICD-10-CM

## 2021-02-05 DIAGNOSIS — Z87891 Personal history of nicotine dependence: Secondary | ICD-10-CM | POA: Insufficient documentation

## 2021-02-05 DIAGNOSIS — O4702 False labor before 37 completed weeks of gestation, second trimester: Secondary | ICD-10-CM | POA: Diagnosis not present

## 2021-02-05 DIAGNOSIS — R109 Unspecified abdominal pain: Secondary | ICD-10-CM

## 2021-02-05 DIAGNOSIS — O26892 Other specified pregnancy related conditions, second trimester: Secondary | ICD-10-CM | POA: Diagnosis not present

## 2021-02-05 DIAGNOSIS — O4442 Low lying placenta NOS or without hemorrhage, second trimester: Secondary | ICD-10-CM | POA: Diagnosis not present

## 2021-02-05 DIAGNOSIS — O4440 Low lying placenta NOS or without hemorrhage, unspecified trimester: Secondary | ICD-10-CM | POA: Diagnosis not present

## 2021-02-05 DIAGNOSIS — O4700 False labor before 37 completed weeks of gestation, unspecified trimester: Secondary | ICD-10-CM

## 2021-02-05 LAB — URINALYSIS, ROUTINE W REFLEX MICROSCOPIC
Bilirubin Urine: NEGATIVE
Glucose, UA: NEGATIVE mg/dL
Hgb urine dipstick: NEGATIVE
Ketones, ur: NEGATIVE mg/dL
Leukocytes,Ua: NEGATIVE
Nitrite: NEGATIVE
Protein, ur: NEGATIVE mg/dL
Specific Gravity, Urine: 1.015 (ref 1.005–1.030)
pH: 6.5 (ref 5.0–8.0)

## 2021-02-05 LAB — WET PREP, GENITAL
Clue Cells Wet Prep HPF POC: NONE SEEN
Sperm: NONE SEEN
Trich, Wet Prep: NONE SEEN
WBC, Wet Prep HPF POC: >=10 — AB (ref <10)
Yeast Wet Prep HPF POC: NONE SEEN

## 2021-02-05 MED ORDER — LACTATED RINGERS IV BOLUS
1000.0000 mL | Freq: Once | INTRAVENOUS | Status: AC
  Administered 2021-02-05: 1000 mL via INTRAVENOUS

## 2021-02-05 NOTE — MAU Provider Note (Signed)
History     CSN: [id number]  Arrival date and time: 02/05/21 1039   None     Chief Complaint  Patient presents with   Abdominal Pain   Back Pain   Nausea   HPI Jenny Clark is a 21 y.o G3P0010 at [redacted]w[redacted]d who presents to MAU for abdominal pain. Patient reports at 0700 she woke up to intense abdominal pain. It felt like she needed to have a BM, so she did. Pain has continued to come over and over and is now radiating into hips and back. She reports she had some vomiting earlier because the pain was so intense. She reports that she feels like the pain is getting somewhat better and is not a dull, achy sensation. She denies vaginal bleeding, discharge, leaking fluid. No itching, odor, urinary s/s. She endorses active fetal movement. She receives [facility] at [facility]. Reports pregnancy has been complicated by a low-lying placenta, but is otherwise normal. Denies recent intercourse or having anything in the vagina.    OB History     Gravida  2   Para  0   Term  0   Preterm  0   AB  1   Living  0      SAB  1   IAB      Ectopic      Multiple      Live Births              Past Medical History:  Diagnosis Date   Anxiety    Bipolar disorder ([place])    not fully diagnosed   Bronchitis    Depression    Drug abuse ([place])    meth, cocaine,molly, marijuana   Headache    migraines   Missed abortion [id number]   Plica syndrome of left knee 01/2014   Pneumonia    as a child    Past Surgical History:  Procedure Laterality Date   DILATION AND EVACUATION N/A 06/19/2017   Procedure: DILATATION AND EVACUATION;  Surgeon: Janyth Contes, MD;  Location: [facility];  Service: Gynecology;  Laterality: N/A;   KNEE ARTHROSCOPY Left 01/23/2014   Procedure: LEFT KNEE SCOPE WITH PLICA EXCISION;  Surgeon: Yvette Rack., MD;  Location: [place];  Service: Orthopedics;  Laterality: Left;   WISDOM TOOTH EXTRACTION  01/10/2014    Family History  Problem Relation  Age of Onset   Hypertension Maternal Grandfather    Other Maternal Grandmother        cerebral amyloiod angiopathy    Social History   Tobacco Use   Smoking status: Former    Packs/day: 0.50    Years: 4.00    Pack years: 2.00    Types: Cigarettes   Smokeless tobacco: Never  Vaping Use   Vaping Use: Never used  Substance Use Topics   Alcohol use: Not Currently    Comment: occas   Drug use: Not Currently    Types: Benzodiazepines, Cocaine, Amphetamines, Marijuana    Comment: denies use as of 02/05/2021    Allergies: No Known Allergies  No medications prior to admission.    Review of Systems  Constitutional: Negative.   Respiratory: Negative.    Cardiovascular: Negative.   Gastrointestinal:  Positive for abdominal pain, nausea and vomiting. Negative for constipation and diarrhea.  Genitourinary: Negative.   Musculoskeletal:  Positive for back pain.  Neurological: Negative.    Physical Exam   Blood pressure (!) 112/53, pulse 91, temperature 98 F (36.7 C),  temperature source Oral, resp. rate 17, height 5\' 5"  (1.651 m), weight 87.7 kg, SpO2 99 %, unknown if currently breastfeeding.  Physical Exam Vitals and nursing note reviewed.  Constitutional:      General: She is not in acute distress.    Appearance: She is well-developed.  Eyes:     Extraocular Movements: Extraocular movements intact.     Pupils: Pupils are equal, round, and reactive to light.  Cardiovascular:     Rate and Rhythm: Tachycardia present.  Pulmonary:     Effort: Pulmonary effort is normal.  Abdominal:     Palpations: Abdomen is soft.     Tenderness: There is no abdominal tenderness.     Comments: Gravid   Genitourinary:    Comments: Blind swabs collects VE: Closed/thick Skin:    General: Skin is warm and dry.  Neurological:     General: No focal deficit present.     Mental Status: She is alert and oriented to person, place, and time.  Psychiatric:        Mood and Affect: Mood normal.         Behavior: Behavior normal.   NST FHR: 140 bpm, moderate variability, +10x10 accels, no decels Toco: ui    MAU Course  Procedures UA IVF bolus Ultrasound-anterior placenta Wet prep, GC/CT  MDM UA unremarkable. Preliminary ultrasound shows anterior placenta without evidence of previa. Toco initially with occasional ui. IVF bolus with improvement in pain. Now feels "small cramp" every 20 mins. Cervix is closed/thick. Wet prep negative. At this time I feel patient is not in labor and is stable for discharge home  Assessment and Plan  [redacted] weeks gestation of pregnancy Preterm contractions  - Discharge home in stable condition - Strict return precautions reviewed. Return to MAU sooner or as needed for worsening symptoms - Keep OB appointment as scheduled on 02/15/2021   Renee Harder, CNM 02/05/2021, 2:52 PM

## 2021-02-05 NOTE — MAU Note (Signed)
Woke up this morning with intense pain in her stomach.  Getting more intense, making her sick to her stomach. Now coming down into pelvis and around to back.  Comes and goes. No bleeding or LOF. Reports +FM.  Had loose stool this morning.

## 2021-02-06 ENCOUNTER — Ambulatory Visit (HOSPITAL_COMMUNITY): Payer: PRIVATE HEALTH INSURANCE | Attending: Cardiology

## 2021-02-06 DIAGNOSIS — R55 Syncope and collapse: Secondary | ICD-10-CM | POA: Insufficient documentation

## 2021-02-06 DIAGNOSIS — Z419 Encounter for procedure for purposes other than remedying health state, unspecified: Secondary | ICD-10-CM | POA: Diagnosis not present

## 2021-02-06 LAB — GC/CHLAMYDIA PROBE AMP (~~LOC~~) NOT AT ARMC
Chlamydia: NEGATIVE
Comment: NEGATIVE
Comment: NORMAL
Neisseria Gonorrhea: NEGATIVE

## 2021-02-06 LAB — ECHOCARDIOGRAM COMPLETE
Area-P 1/2: 5.2 cm2
S' Lateral: 3.1 cm

## 2021-02-15 DIAGNOSIS — Z365 Encounter for antenatal screening for isoimmunization: Secondary | ICD-10-CM | POA: Diagnosis not present

## 2021-02-15 DIAGNOSIS — Z3689 Encounter for other specified antenatal screening: Secondary | ICD-10-CM | POA: Diagnosis not present

## 2021-02-15 DIAGNOSIS — Z3A28 28 weeks gestation of pregnancy: Secondary | ICD-10-CM | POA: Diagnosis not present

## 2021-02-15 DIAGNOSIS — Z23 Encounter for immunization: Secondary | ICD-10-CM | POA: Diagnosis not present

## 2021-02-28 ENCOUNTER — Ambulatory Visit (INDEPENDENT_AMBULATORY_CARE_PROVIDER_SITE_OTHER): Payer: Managed Care, Other (non HMO) | Admitting: Cardiology

## 2021-02-28 ENCOUNTER — Other Ambulatory Visit: Payer: Self-pay

## 2021-02-28 ENCOUNTER — Encounter: Payer: Self-pay | Admitting: Cardiology

## 2021-02-28 VITALS — BP 102/64 | HR 105 | Ht 65.0 in | Wt 192.8 lb

## 2021-02-28 DIAGNOSIS — R002 Palpitations: Secondary | ICD-10-CM

## 2021-02-28 DIAGNOSIS — O99210 Obesity complicating pregnancy, unspecified trimester: Secondary | ICD-10-CM | POA: Insufficient documentation

## 2021-02-28 NOTE — Patient Instructions (Signed)
Medication Instructions:  Your physician recommends that you continue on your current medications as directed. Please refer to the Current Medication list given to you today.  *If you need a refill on your cardiac medications before your next appointment, please call your pharmacy*   Lab Work: None If you have labs (blood work) drawn today and your tests are completely normal, you will receive your results only by: MyChart Message (if you have MyChart) OR A paper copy in the mail If you have any lab test that is abnormal or we need to change your treatment, we will call you to review the results.   Testing/Procedures: None   Follow-Up: At [facility], you and your health needs are our priority.  As part of our continuing mission to provide you with exceptional heart care, we have created designated Provider Care Teams.  These Care Teams include your primary Cardiologist (physician) and Advanced Practice Providers (APPs -  Physician Assistants and Nurse Practitioners) who all work together to provide you with the care you need, when you need it.  We recommend signing up for the patient portal called "MyChart".  Sign up information is provided on this After Visit Summary.  MyChart is used to connect with patients for Virtual Visits (Telemedicine).  Patients are able to view lab/test results, encounter notes, upcoming appointments, etc.  Non-urgent messages can be sent to your provider as well.   To learn more about what you can do with MyChart, go to [email or web address].    Your next appointment:   2nd-3rd week in May  The format for your next appointment:   Virtual Visit   Provider:   Thomasene Ripple, DO     Other Instructions

## 2021-02-28 NOTE — Progress Notes (Signed)
Cardio-Obstetrics Clinic  Follow Up Note   Date:  02/28/2021   ID:  Jenny Clark, DOB 2000/04/06, MRN [id number]  PCP:  Marva Panda, NP   [facility] Providers Cardiologist:  Thomasene Ripple, DO  Electrophysiologist:  None        Referring MD: Marva Panda, NP   Chief Complaint: " I am doing a lot better on the propanolol"  History of Present Illness:    Jenny Clark is a 21 y.o. female [G2P0010] who returns for follow up of palpitations.  I initially saw the patient on 01/25/2021 at that time was experiencing significant palpitations with symptoms.  She also did have a syncope episode prior to her visit.  During her visit I recommended she get an echocardiogram to assess for any structural abnormalities.  In the interim the patient contacted me and said that her palpitations were worsening and was experiencing debilitating symptoms.  I started the patient on propanolol 10 mg twice a day.  Today she tells me that she has had significant improvement with this.  She is really happy for being on the medication.  No shortness of breath or chest pain.   Prior CV Studies Reviewed: The following studies were reviewed today:  TTE 02/06/2021 IMPRESSIONS   1. Left ventricular ejection fraction, by estimation, is 60 to 65%. The left ventricle has normal function. The left ventricle has no regional  wall motion abnormalities. Left ventricular diastolic parameters were normal.   2. Right ventricular systolic function is normal. The right ventricular size is normal. Tricuspid regurgitation signal is inadequate for assessing  PA pressure.   3. The pericardial effusion is anterior to the right ventricle.  4. The mitral valve is normal in structure. Trivial mitral valve  regurgitation. No evidence of mitral stenosis.   5. The aortic valve is normal in structure. Aortic valve regurgitation is not visualized. No aortic stenosis is present.   6. The inferior vena cava is normal  in size with greater than 50% respiratory variability, suggesting right atrial pressure of 3 mmHg.   FINDINGS   Left Ventricle: Left ventricular ejection fraction, by estimation, is 60 to 65%. The left ventricle has normal function. The left ventricle has no  regional wall motion abnormalities. The left ventricular internal cavity  size was normal in size. There is   no left ventricular hypertrophy. Left ventricular diastolic parameters  were normal. Normal left ventricular filling pressure.   Right Ventricle: The right ventricular size is normal. No increase in  right ventricular wall thickness. Right ventricular systolic function is  normal. Tricuspid regurgitation signal is inadequate for assessing PA  pressure.   Left Atrium: Left atrial size was normal in size.   Right Atrium: Right atrial size was normal in size.   Pericardium: Trivial pericardial effusion is present. The pericardial  effusion is anterior to the right ventricle.   Mitral Valve: The mitral valve is normal in structure. Trivial mitral  valve regurgitation. No evidence of mitral valve stenosis.   Tricuspid Valve: The tricuspid valve is normal in structure. Tricuspid  valve regurgitation is trivial. No evidence of tricuspid stenosis.   Aortic Valve: The aortic valve is normal in structure. Aortic valve  regurgitation is not visualized. No aortic stenosis is present.   Pulmonic Valve: The pulmonic valve was normal in structure. Pulmonic valve  regurgitation is mild. No evidence of pulmonic stenosis.   Aorta: The aortic root is normal in size and structure.   Venous: The inferior vena cava  is normal in size with greater than 50%  respiratory variability, suggesting right atrial pressure of 3 mmHg.   IAS/Shunts: No atrial level shunt detected by color flow Doppler.    Past Medical History:  Diagnosis Date   Anxiety    Bipolar disorder (HCC)    not fully diagnosed   Bronchitis    Depression    Drug abuse  (HCC)    meth, cocaine,molly, marijuana   Headache    migraines   Missed abortion 06/17/2017   Plica syndrome of left knee 01/2014   Pneumonia    as a child    Past Surgical History:  Procedure Laterality Date   DILATION AND EVACUATION N/A 06/19/2017   Procedure: DILATATION AND EVACUATION;  Surgeon: Sherian Rein, MD;  Location: [facility];  Service: Gynecology;  Laterality: N/A;   KNEE ARTHROSCOPY Left 01/23/2014   Procedure: LEFT KNEE SCOPE WITH PLICA EXCISION;  Surgeon: Thera Flake., MD;  Location: [facility];  Service: Orthopedics;  Laterality: Left;   WISDOM TOOTH EXTRACTION  01/10/2014      OB History     Gravida  2   Para  0   Term  0   Preterm  0   AB  1   Living  0      SAB  1   IAB      Ectopic      Multiple      Live Births                  Current Medications: Current Meds  Medication Sig   Prenatal Vit-Fe Fumarate-FA (MULTIVITAMIN-PRENATAL) 27-0.8 MG TABS tablet Take 1 tablet by mouth daily at 12 noon.   propranolol (INDERAL) 10 MG tablet Take 1 tablet (10 mg total) by mouth 2 (two) times daily.     Allergies:   Patient has no known allergies.   Social History   Socioeconomic History   Marital status: Single    Spouse name: Not on file   Number of children: 0   Years of education: 14   Highest education level: Not on file  Occupational History   Occupation: [employer]  Tobacco Use   Smoking status: Former    Packs/day: 0.50    Years: 4.00    Pack years: 2.00    Types: Cigarettes   Smokeless tobacco: Never  Vaping Use   Vaping Use: Never used  Substance and Sexual Activity   Alcohol use: Not Currently    Comment: occas   Drug use: Not Currently    Types: Benzodiazepines, Cocaine, Amphetamines, Marijuana    Comment: denies use as of 02/05/2021   Sexual activity: Not Currently    Comment: offered condoms  Other Topics Concern   Not on file  Social History Narrative   Lives with aunt    Caffeine use: daily   Right handed    Social Determinants of Health   Financial Resource Strain: Not on file  Food Insecurity: Not on file  Transportation Needs: Not on file  Physical Activity: Not on file  Stress: Not on file  Social Connections: Not on file      Family History  Problem Relation Age of Onset   Hypertension Maternal Grandfather    Other Maternal Grandmother        cerebral amyloiod angiopathy      ROS:   Please see the history of present illness.      All other systems reviewed  and are negative.   Labs/EKG Reviewed:    EKG:   EKG is was not  ordered today.   Recent Labs: 12/11/2020: ALT 22; BUN 6; Creatinine, Ser 0.52; Hemoglobin 12.3; Platelets 251; Potassium 3.8; Sodium 135   Recent Lipid Panel Lab Results  Component Value Date/Time   CHOL 174 04/12/2018 06:32 AM   TRIG 91 04/12/2018 06:32 AM   HDL 63 04/12/2018 06:32 AM   CHOLHDL 2.8 04/12/2018 06:32 AM   LDLCALC 93 04/12/2018 06:32 AM    Physical Exam:    VS:  BP 102/64    Pulse (!) 105    Ht 5\' 5"  (1.651 m)    Wt 192 lb 12.8 oz (87.5 kg)    SpO2 97%    BMI 32.08 kg/m     Wt Readings from Last 3 Encounters:  02/28/21 192 lb 12.8 oz (87.5 kg)  02/05/21 193 lb 6.4 oz (87.7 kg)  01/25/21 193 lb 14.4 oz (88 kg)     GEN:  Well nourished, well developed in no acute distress HEENT: Normal NECK: No JVD; No carotid bruits LYMPHATICS: No lymphadenopathy CARDIAC: RRR, no murmurs, rubs, gallops RESPIRATORY:  Clear to auscultation without rales, wheezing or rhonchi  ABDOMEN: Soft, non-tender, non-distended MUSCULOSKELETAL:  No edema; No deformity  SKIN: Warm and dry NEUROLOGIC:  Alert and oriented x 3 PSYCHIATRIC:  Normal affect    Risk Assessment/Risk Calculators:                 ASSESSMENT & PLAN:    Palpitations-she has had great improvement with the propanolol I am very happy for the patient.  We will continue this medication at 10 mg twice daily.  Syncope-no recurrent  syncope there is no need for any further evaluation.  Obesity in pregnancy-the patient understands the need to lose weight with diet and exercise. We have discussed specific strategies for this.  Patient Instructions  Medication Instructions:  Your physician recommends that you continue on your current medications as directed. Please refer to the Current Medication list given to you today.  *If you need a refill on your cardiac medications before your next appointment, please call your pharmacy*   Lab Work: None If you have labs (blood work) drawn today and your tests are completely normal, you will receive your results only by: MyChart Message (if you have MyChart) OR A paper copy in the mail If you have any lab test that is abnormal or we need to change your treatment, we will call you to review the results.   Testing/Procedures: None   Follow-Up: At [facility], you and your health needs are our priority.  As part of our continuing mission to provide you with exceptional heart care, we have created designated Provider Care Teams.  These Care Teams include your primary Cardiologist (physician) and Advanced Practice Providers (APPs -  Physician Assistants and Nurse Practitioners) who all work together to provide you with the care you need, when you need it.  We recommend signing up for the patient portal called "MyChart".  Sign up information is provided on this After Visit Summary.  MyChart is used to connect with patients for Virtual Visits (Telemedicine).  Patients are able to view lab/test results, encounter notes, upcoming appointments, etc.  Non-urgent messages can be sent to your provider as well.   To learn more about what you can do with MyChart, go to [facility].    Your next appointment:   2nd-3rd week in May  The format for  your next appointment:   Virtual Visit   Provider:   Thomasene RippleKardie Shelah Heatley, DO     Other Instructions     Dispo:  No follow-ups on  file.   Medication Adjustments/Labs and Tests Ordered: Current medicines are reviewed at length with the patient today.  Concerns regarding medicines are outlined above.  Tests Ordered: No orders of the defined types were placed in this encounter.  Medication Changes: No orders of the defined types were placed in this encounter.

## 2021-03-06 DIAGNOSIS — Z419 Encounter for procedure for purposes other than remedying health state, unspecified: Secondary | ICD-10-CM | POA: Diagnosis not present

## 2021-03-19 DIAGNOSIS — O4443 Low lying placenta NOS or without hemorrhage, third trimester: Secondary | ICD-10-CM | POA: Diagnosis not present

## 2021-03-19 DIAGNOSIS — Z3483 Encounter for supervision of other normal pregnancy, third trimester: Secondary | ICD-10-CM | POA: Diagnosis not present

## 2021-03-19 DIAGNOSIS — Z3A32 32 weeks gestation of pregnancy: Secondary | ICD-10-CM | POA: Diagnosis not present

## 2021-04-06 DIAGNOSIS — Z419 Encounter for procedure for purposes other than remedying health state, unspecified: Secondary | ICD-10-CM | POA: Diagnosis not present

## 2021-04-16 ENCOUNTER — Ambulatory Visit: Payer: Managed Care, Other (non HMO) | Admitting: Cardiology

## 2021-04-17 DIAGNOSIS — Z113 Encounter for screening for infections with a predominantly sexual mode of transmission: Secondary | ICD-10-CM | POA: Diagnosis not present

## 2021-04-17 DIAGNOSIS — Z3685 Encounter for antenatal screening for Streptococcus B: Secondary | ICD-10-CM | POA: Diagnosis not present

## 2021-04-17 DIAGNOSIS — Z3483 Encounter for supervision of other normal pregnancy, third trimester: Secondary | ICD-10-CM | POA: Diagnosis not present

## 2021-04-17 DIAGNOSIS — Z3A36 36 weeks gestation of pregnancy: Secondary | ICD-10-CM | POA: Diagnosis not present

## 2021-04-17 LAB — OB RESULTS CONSOLE GC/CHLAMYDIA
Chlamydia: NEGATIVE
Gonorrhea: NEGATIVE

## 2021-04-17 LAB — OB RESULTS CONSOLE GBS: GBS: POSITIVE

## 2021-04-19 ENCOUNTER — Ambulatory Visit: Payer: Managed Care, Other (non HMO) | Admitting: Cardiology

## 2021-04-29 ENCOUNTER — Telehealth (HOSPITAL_COMMUNITY): Payer: Self-pay | Admitting: *Deleted

## 2021-04-29 ENCOUNTER — Encounter (HOSPITAL_COMMUNITY): Payer: Self-pay | Admitting: *Deleted

## 2021-04-29 LAB — OB RESULTS CONSOLE ABO/RH: RH Type: NEGATIVE

## 2021-04-29 LAB — OB RESULTS CONSOLE RUBELLA ANTIBODY, IGM: Rubella: IMMUNE

## 2021-04-29 LAB — OB RESULTS CONSOLE RPR: RPR: NONREACTIVE

## 2021-04-29 LAB — OB RESULTS CONSOLE HEPATITIS B SURFACE ANTIGEN: Hepatitis B Surface Ag: NEGATIVE

## 2021-04-29 NOTE — Telephone Encounter (Signed)
Preadmission screen  

## 2021-05-03 ENCOUNTER — Encounter (HOSPITAL_COMMUNITY): Payer: Self-pay | Admitting: Obstetrics and Gynecology

## 2021-05-03 ENCOUNTER — Inpatient Hospital Stay (HOSPITAL_COMMUNITY): Payer: Managed Care, Other (non HMO)

## 2021-05-03 ENCOUNTER — Inpatient Hospital Stay (HOSPITAL_COMMUNITY): Payer: Managed Care, Other (non HMO) | Admitting: Anesthesiology

## 2021-05-03 ENCOUNTER — Other Ambulatory Visit: Payer: Self-pay

## 2021-05-03 ENCOUNTER — Inpatient Hospital Stay (HOSPITAL_COMMUNITY)
Admission: AD | Admit: 2021-05-03 | Discharge: 2021-05-05 | DRG: 807 | Disposition: A | Discharge status: Home / Self Care | Payer: Managed Care, Other (non HMO) | Attending: Obstetrics and Gynecology | Admitting: Obstetrics and Gynecology

## 2021-05-03 DIAGNOSIS — O26893 Other specified pregnancy related conditions, third trimester: Secondary | ICD-10-CM | POA: Diagnosis present

## 2021-05-03 DIAGNOSIS — R002 Palpitations: Secondary | ICD-10-CM | POA: Diagnosis not present

## 2021-05-03 DIAGNOSIS — O99892 Other specified diseases and conditions complicating childbirth: Secondary | ICD-10-CM | POA: Diagnosis present

## 2021-05-03 DIAGNOSIS — O99824 Streptococcus B carrier state complicating childbirth: Secondary | ICD-10-CM | POA: Diagnosis present

## 2021-05-03 DIAGNOSIS — Z6791 Unspecified blood type, Rh negative: Secondary | ICD-10-CM | POA: Diagnosis not present

## 2021-05-03 DIAGNOSIS — Z87891 Personal history of nicotine dependence: Secondary | ICD-10-CM

## 2021-05-03 DIAGNOSIS — Z3490 Encounter for supervision of normal pregnancy, unspecified, unspecified trimester: Secondary | ICD-10-CM

## 2021-05-03 DIAGNOSIS — Z3A39 39 weeks gestation of pregnancy: Secondary | ICD-10-CM | POA: Diagnosis not present

## 2021-05-03 LAB — TYPE AND SCREEN
ABO/RH(D): A NEG
Antibody Screen: NEGATIVE

## 2021-05-03 LAB — CBC
HCT: 31.7 % — ABNORMAL LOW (ref 36.0–46.0)
Hemoglobin: 10.3 g/dL — ABNORMAL LOW (ref 12.0–15.0)
MCH: 26.4 pg (ref 26.0–34.0)
MCHC: 32.5 g/dL (ref 30.0–36.0)
MCV: 81.3 fL (ref 80.0–100.0)
Platelets: 268 10*3/uL (ref 150–400)
RBC: 3.9 MIL/uL (ref 3.87–5.11)
RDW: 14.9 % (ref 11.5–15.5)
WBC: 13.6 10*3/uL — ABNORMAL HIGH (ref 4.0–10.5)
nRBC: 0 % (ref 0.0–0.2)

## 2021-05-03 LAB — RPR: RPR Ser Ql: NONREACTIVE

## 2021-05-03 MED ORDER — FENTANYL-BUPIVACAINE-NACL 0.5-0.125-0.9 MG/250ML-% EP SOLN: 12.0000 mL/h | EPIDURAL | Status: DC | PRN

## 2021-05-03 MED ORDER — SOD CITRATE-CITRIC ACID 500-334 MG/5ML PO SOLN: 30.0000 mL | ORAL | Status: DC | PRN

## 2021-05-03 MED ORDER — LIDOCAINE HCL (PF) 1 % IJ SOLN: 30.0000 mL | INTRAMUSCULAR | Status: DC | PRN

## 2021-05-03 MED ORDER — OXYTOCIN-SODIUM CHLORIDE 30-0.9 UT/500ML-% IV SOLN
1.0000 m[IU]/min | INTRAVENOUS | Status: DC
  Administered 2021-05-03: 1 m[IU]/min via INTRAVENOUS

## 2021-05-03 MED ORDER — EPHEDRINE 5 MG/ML INJ: 10.0000 mg | INTRAVENOUS | Status: DC | PRN

## 2021-05-03 MED ORDER — OXYCODONE-ACETAMINOPHEN 5-325 MG PO TABS: 2.0000 | ORAL_TABLET | ORAL | Status: DC | PRN

## 2021-05-03 MED ORDER — PHENYLEPHRINE 80 MCG/ML (10ML) SYRINGE FOR IV PUSH (FOR BLOOD PRESSURE SUPPORT)
80.0000 ug | PREFILLED_SYRINGE | INTRAVENOUS | Status: DC | PRN
  Administered 2021-05-03: 80 ug via INTRAVENOUS

## 2021-05-03 MED ORDER — OXYCODONE-ACETAMINOPHEN 5-325 MG PO TABS: 1.0000 | ORAL_TABLET | ORAL | Status: DC | PRN

## 2021-05-03 MED ORDER — PENICILLIN G POT IN DEXTROSE 60000 UNIT/ML IV SOLN
3.0000 10*6.[IU] | INTRAVENOUS | Status: DC
  Administered 2021-05-03 (×2): 3 10*6.[IU] via INTRAVENOUS
  Filled 2021-05-03 (×2): qty 50

## 2021-05-03 MED ORDER — LACTATED RINGERS IV SOLN
500.0000 mL | INTRAVENOUS | Status: DC | PRN
  Administered 2021-05-03: 1000 mL via INTRAVENOUS
  Administered 2021-05-03: 500 mL via INTRAVENOUS

## 2021-05-03 MED ORDER — TERBUTALINE SULFATE 1 MG/ML IJ SOLN: 0.2500 mg | Freq: Once | INTRAMUSCULAR | Status: DC | PRN

## 2021-05-03 MED ORDER — FENTANYL-BUPIVACAINE-NACL 0.5-0.125-0.9 MG/250ML-% EP SOLN
12.0000 mL/h | EPIDURAL | Status: DC | PRN
  Administered 2021-05-03: 12 mL/h via EPIDURAL
  Filled 2021-05-03: qty 250

## 2021-05-03 MED ORDER — OXYTOCIN-SODIUM CHLORIDE 30-0.9 UT/500ML-% IV SOLN
2.5000 [IU]/h | INTRAVENOUS | Status: DC
  Administered 2021-05-04: 2.5 [IU]/h via INTRAVENOUS

## 2021-05-03 MED ORDER — PROPRANOLOL HCL 10 MG PO TABS
10.0000 mg | ORAL_TABLET | Freq: Two times a day (BID) | ORAL | Status: DC
Start: 2021-05-03 — End: 2021-05-04
  Administered 2021-05-03: 10 mg via ORAL
  Filled 2021-05-03 (×2): qty 1

## 2021-05-03 MED ORDER — ONDANSETRON HCL 4 MG/2ML IJ SOLN
4.0000 mg | Freq: Four times a day (QID) | INTRAMUSCULAR | Status: DC | PRN
  Administered 2021-05-03: 4 mg via INTRAVENOUS
  Filled 2021-05-03: qty 2

## 2021-05-03 MED ORDER — MISOPROSTOL 25 MCG QUARTER TABLET
25.0000 ug | ORAL_TABLET | ORAL | Status: DC | PRN
  Administered 2021-05-03: 25 ug via VAGINAL
  Filled 2021-05-03: qty 1

## 2021-05-03 MED ORDER — LIDOCAINE HCL (PF) 1 % IJ SOLN
INTRAMUSCULAR | Status: DC | PRN
  Administered 2021-05-03: 8 mL via EPIDURAL

## 2021-05-03 MED ORDER — PHENYLEPHRINE 80 MCG/ML (10ML) SYRINGE FOR IV PUSH (FOR BLOOD PRESSURE SUPPORT)
80.0000 ug | PREFILLED_SYRINGE | INTRAVENOUS | Status: DC | PRN
  Administered 2021-05-03 (×2): 80 ug via INTRAVENOUS
  Filled 2021-05-03: qty 10

## 2021-05-03 MED ORDER — DIPHENHYDRAMINE HCL 50 MG/ML IJ SOLN
12.5000 mg | INTRAMUSCULAR | Status: DC | PRN
  Administered 2021-05-03: 12.5 mg via INTRAVENOUS

## 2021-05-03 MED ORDER — ACETAMINOPHEN 325 MG PO TABS: 650.0000 mg | ORAL_TABLET | ORAL | Status: DC | PRN

## 2021-05-03 MED ORDER — DIPHENHYDRAMINE HCL 50 MG/ML IJ SOLN
12.5000 mg | INTRAMUSCULAR | Status: DC | PRN
  Filled 2021-05-03: qty 1

## 2021-05-03 MED ORDER — SODIUM CHLORIDE 0.9 % IV SOLN
5.0000 10*6.[IU] | Freq: Once | INTRAVENOUS | Status: AC
  Administered 2021-05-03: 5 10*6.[IU] via INTRAVENOUS
  Filled 2021-05-03: qty 5

## 2021-05-03 MED ORDER — EPHEDRINE 5 MG/ML INJ
10.0000 mg | INTRAVENOUS | Status: DC | PRN
Start: 2021-05-03 — End: 2021-05-04
  Administered 2021-05-03: 10 mg via INTRAVENOUS
  Filled 2021-05-03: qty 5

## 2021-05-03 MED ORDER — LACTATED RINGERS IV SOLN: INTRAVENOUS | Status: DC

## 2021-05-03 MED ORDER — OXYTOCIN-SODIUM CHLORIDE 30-0.9 UT/500ML-% IV SOLN
1.0000 m[IU]/min | INTRAVENOUS | Status: DC
  Administered 2021-05-03: 2 m[IU]/min via INTRAVENOUS
  Filled 2021-05-03: qty 500

## 2021-05-03 MED ORDER — OXYTOCIN BOLUS FROM INFUSION
333.0000 mL | Freq: Once | INTRAVENOUS | Status: DC
  Administered 2021-05-03: 333 mL via INTRAVENOUS

## 2021-05-03 MED ORDER — LACTATED RINGERS IV SOLN
500.0000 mL | Freq: Once | INTRAVENOUS | Status: AC
  Administered 2021-05-03: 500 mL via INTRAVENOUS

## 2021-05-03 MED ORDER — FENTANYL CITRATE (PF) 100 MCG/2ML IJ SOLN: 50.0000 ug | INTRAMUSCULAR | Status: DC | PRN

## 2021-05-03 NOTE — Anesthesia Preprocedure Evaluation (Signed)
Anesthesia Evaluation  ?Patient identified by MRN, date of birth, ID band ?Patient awake ? ? ? ?Reviewed: ?Allergy & Precautions, H&P , NPO status , Patient's Chart, lab work & pertinent test results, reviewed documented beta blocker date and time  ? ?Airway ?Mallampati: II ? ?TM Distance: >3 FB ?Neck ROM: full ? ? ? Dental ?no notable dental hx. ? ?  ?Pulmonary ?neg pulmonary ROS, Patient abstained from smoking., former smoker,  ?  ?Pulmonary exam normal ?breath sounds clear to auscultation ? ? ? ? ? ? Cardiovascular ?negative cardio ROS ?Normal cardiovascular exam ?Rhythm:regular Rate:Normal ? ? ?  ?Neuro/Psych ?negative neurological ROS ? negative psych ROS  ? GI/Hepatic ?negative GI ROS, Neg liver ROS,   ?Endo/Other  ?negative endocrine ROS ? Renal/GU ?negative Renal ROS  ?negative genitourinary ?  ?Musculoskeletal ? ? Abdominal ?  ?Peds ? Hematology ?negative hematology ROS ?(+)   ?Anesthesia Other Findings ? ? Reproductive/Obstetrics ?(+) Pregnancy ? ?  ? ? ? ? ? ? ? ? ? ? ? ? ? ?  ?  ? ? ? ? ? ? ? ? ?Anesthesia Physical ?Anesthesia Plan ? ?ASA: 2 ? ?Anesthesia Plan: Epidural  ? ?Post-op Pain Management: Minimal or no pain anticipated  ? ?Induction:  ? ?PONV Risk Score and Plan: 2 ? ?Airway Management Planned: Simple Face Mask ? ?Additional Equipment: None ? ?Intra-op Plan:  ? ?Post-operative Plan:  ? ?Informed Consent: I have reviewed the patients History and Physical, chart, labs and discussed the procedure including the risks, benefits and alternatives for the proposed anesthesia with the patient or authorized representative who has indicated his/her understanding and acceptance.  ? ? ? ?Dental Advisory Given ? ?Plan Discussed with: Anesthesiologist ? ?Anesthesia Plan Comments: (Labs checked- platelets confirmed with RN in room. Fetal heart tracing, per RN, reported to be stable enough for sitting procedure. ?Discussed epidural, and patient consents to the procedure:   included risk of possible headache,backache, failed block, allergic reaction, and nerve injury. This patient was asked if she had any questions or concerns before the procedure started.)  ? ? ? ? ? ? ?Anesthesia Quick Evaluation ? ?

## 2021-05-03 NOTE — Anesthesia Procedure Notes (Signed)
Epidural ?Patient location during procedure: OB ?Start time: 05/03/2021 12:28 PM ?End time: 05/03/2021 12:32 PM ? ?Staffing ?Anesthesiologist: [name], MD ? ?Preanesthetic Checklist ?Completed: patient identified, IV checked, site marked, risks and benefits discussed, surgical consent, monitors and equipment checked, pre-op evaluation and timeout performed ? ?Epidural ?Patient position: sitting ?Prep: DuraPrep and site prepped and draped ?Patient monitoring: continuous pulse ox and blood pressure ?Approach: midline ?Location: L3-L4 ?Injection technique: LOR air ? ?Needle:  ?Needle type: Tuohy  ?Needle gauge: 17 G ?Needle length: 9 cm and 9 ?Needle insertion depth: 7 cm ?Catheter type: closed end flexible ?Catheter size: 19 Gauge ?Catheter at skin depth: 13 cm ?Test dose: negative ? ?Assessment ?Events: blood not aspirated, injection not painful, no injection resistance, no paresthesia and negative IV test ? ? ? ?

## 2021-05-03 NOTE — Progress Notes (Signed)
Patient ID: Jenny Clark, female   DOB: 08/10/2000, 21 y.o.   MRN: [id number] ?Pt got uncomfortable and had SROM about 12 noon.  She then got an epidural and had a run of recurrent decelerations due to hypotension then continued to have some variables. ? ?FSE and IUPC placed and pitocin which was only on 46mu was d/c ? ?FHR improved with repositioning and d/c of pitocin and now with good variability and accels, occasional mild variable decels ? ?Cervix 3-4/80/-1 ? ?MVU adequate off pitocin so will just follow progress ?

## 2021-05-03 NOTE — Progress Notes (Signed)
Patient ID: Jenny Clark, female   DOB: 07/13/2000, 21 y.o.   MRN: [id number] ?CTSP for prolonged deceleration to 90 for 5-6 minutes ? ?Resolved with repositioning to knee/chest and when contractions had a break.  Pt is not on pitocin, just contracting on her own.   ? ?FHR back to baseline of 130 and no significant decels. ? ?Cervix 90/6-7/-1 ? ?Continue to follow ?Making progress, baby feels OP ? ? ?

## 2021-05-03 NOTE — Progress Notes (Signed)
Patient ID: Jenny Clark, female   DOB: 07-07-2000, 21 y.o.   MRN: [id number] ?Pt comfortable with epidural ? ?Afeb VSS ?FHR with good variability and some early decels with contractions ? ?Cervix 5/90/-1 ? ?Contractions adequate without pitocin thus far, making change ?Continue to monitor ?

## 2021-05-03 NOTE — Progress Notes (Signed)
Patient ID: Jenny Clark, female   DOB: 2000/10/15, 21 y.o.   MRN: [id number] ?Pt admitted and received cytotec x 1, feeling mild contractions ? ?FHR category 1 overall with occasional variable decelerations ? ?Cervix 1-2/60/-2 ? ?Cervix responded well to cytotec x 1, will start pitocin and plan AROM  ?when contractions more intense ?PCN on board for +GBS ?Continue propanolol for palpitations, took dose this AM ? ? ? ?

## 2021-05-03 NOTE — H&P (Signed)
Jenny Clark is a 21 y.o. female G2P0010 at 23 0/7 weeks (EDD 05/10/21 by 6 week [name]) presenting for elective IOL. ? ?Prenatal care significant for: ?1) Mood disorder ?     H/o depression, bipolar  ?2) Palpitations  ?Saw Cards:Start propanolol bid ?Echo Feb 1 : EF 60-65% ?AGA u/s at 32 weeks ?3) Low lying placenta  ?resolved ?4) Blood group A Rh(D) negative  ?rhogam at 27 weeks ?5) Group B Strep Carrier-PCN in labor ? ?OB History   ? ? Gravida  ?2  ? Para  ?0  ? Term  ?0  ? Preterm  ?0  ? AB  ?1  ? Living  ?0  ?  ? ? SAB  ?1  ? IAB  ?   ? Ectopic  ?   ? Multiple  ?   ? Live Births  ?   ?   ?  ?  ?SAB x 1 2019 ? ?Past Medical History:  ?Diagnosis Date  ? Anxiety   ? Bipolar disorder (HCC)   ? not fully diagnosed  ? Bronchitis   ? Depression   ? Drug abuse (HCC)   ? meth, cocaine,molly, marijuana  ? Headache   ? migraines  ? Missed abortion 06/17/2017  ? Plica syndrome of left knee 01/2014  ? Pneumonia   ? as a child  ? Tachycardia   ? ?Past Surgical History:  ?Procedure Laterality Date  ? DILATION AND EVACUATION N/A 06/19/2017  ? Procedure: DILATATION AND EVACUATION;  Surgeon: Sherian Rein, MD;  Location: [facility];  Service: Gynecology;  Laterality: N/A;  ? KNEE ARTHROSCOPY Left 01/23/2014  ? Procedure: LEFT KNEE SCOPE WITH PLICA EXCISION;  Surgeon: Thera Flake., MD;  Location: [facility];  Service: Orthopedics;  Laterality: Left;  ? WISDOM TOOTH EXTRACTION  01/10/2014  ? ?Family History: family history includes Breast cancer in her maternal grandmother; Hypertension in her maternal grandfather; Other in her maternal grandmother. ?Social History:  reports that she has quit smoking. Her smoking use included cigarettes. She has a 2.00 pack-year smoking history. She has never used smokeless tobacco. She reports that she does not currently use alcohol. She reports that she does not currently use drugs after having used the following drugs: Benzodiazepines, Cocaine, Amphetamines, and Marijuana. ? ? ?   ?Maternal Diabetes: No ?Genetic Screening: Normal ?Maternal Ultrasounds/Referrals: Normal ?Fetal Ultrasounds or other Referrals:  None ?Maternal Substance Abuse:  No ?Significant Maternal Medications:  Meds include: Other: propanolol ?Significant Maternal Lab Results:  Group B Strep positive and Rh negative ?Other Comments:  None ? ?Review of Systems  ?Constitutional:  Negative for fever.  ?Gastrointestinal:  Negative for abdominal pain.  ?Genitourinary:  Negative for vaginal bleeding.  ?Maternal Medical History:  ?Contractions: Frequency: irregular.   ?Perceived severity is mild.   ?Fetal activity: Perceived fetal activity is normal.   ?Prenatal complications: H/o depression/bipolar, palpitations ? ?  ?Weight 97.9 kg, unknown if currently breastfeeding. ?Maternal Exam:  ?Uterine Assessment: Contraction strength is mild.  Contraction frequency is irregular.  ?Abdomen: Patient reports no abdominal tenderness. Fetal presentation: vertex ?Introitus: Normal vulva. Normal vagina.   ?Physical Exam ?Constitutional:   ?   Appearance: Normal appearance.  ?Cardiovascular:  ?   Rate and Rhythm: Normal rate and regular rhythm.  ?Pulmonary:  ?   Effort: Pulmonary effort is normal.  ?Abdominal:  ?   Palpations: Abdomen is soft.  ?Genitourinary: ?   General: Normal vulva.  ?Neurological:  ?   Mental Status:  She is alert.  ?Psychiatric:     ?   Mood and Affect: Mood normal.  ?  ?Prenatal labs: ?ABO, Rh: --/Negative/-- (04/24 0000) ?Antibody:  negative ?Rubella: Immune (04/24 0000) ?RPR: Nonreactive (04/24 0000)  ?HBsAg: Negative (04/24 0000)  ?HIV: Non-reactive (10/03 0000)  ?GBS: Positive/-- (04/12 0000)  ?One hour GCT 139, three hour GTT WNL ?Hgb AA ?Carrier screen negative x 2 ?NIPT low risk ? ?Assessment/Plan: ?Plan ripening and IOL at term.  Pt did not get in at midnight, so starting cytotec   ? ?Jenny Clark ?05/03/2021, 7:40 AM ? ? ? ? ?

## 2021-05-04 ENCOUNTER — Encounter (HOSPITAL_COMMUNITY): Payer: Self-pay | Admitting: Obstetrics and Gynecology

## 2021-05-04 LAB — CBC
HCT: 29.5 % — ABNORMAL LOW (ref 36.0–46.0)
Hemoglobin: 9.7 g/dL — ABNORMAL LOW (ref 12.0–15.0)
MCH: 26.1 pg (ref 26.0–34.0)
MCHC: 32.9 g/dL (ref 30.0–36.0)
MCV: 79.5 fL — ABNORMAL LOW (ref 80.0–100.0)
Platelets: 219 10*3/uL (ref 150–400)
RBC: 3.71 MIL/uL — ABNORMAL LOW (ref 3.87–5.11)
RDW: 15.1 % (ref 11.5–15.5)
WBC: 19.5 10*3/uL — ABNORMAL HIGH (ref 4.0–10.5)
nRBC: 0 % (ref 0.0–0.2)

## 2021-05-04 MED ORDER — SENNOSIDES-DOCUSATE SODIUM 8.6-50 MG PO TABS
2.0000 | ORAL_TABLET | ORAL | Status: DC
  Administered 2021-05-04 – 2021-05-05 (×2): 2 via ORAL
  Filled 2021-05-04 (×2): qty 2

## 2021-05-04 MED ORDER — DIBUCAINE (PERIANAL) 1 % EX OINT: 1.0000 "application " | TOPICAL_OINTMENT | CUTANEOUS | Status: DC | PRN

## 2021-05-04 MED ORDER — PRENATAL MULTIVITAMIN CH
1.0000 | ORAL_TABLET | Freq: Every day | ORAL | Status: DC
  Administered 2021-05-04: 1 via ORAL
  Filled 2021-05-04: qty 1

## 2021-05-04 MED ORDER — RHO D IMMUNE GLOBULIN 1500 UNIT/2ML IJ SOSY
300.0000 ug | PREFILLED_SYRINGE | Freq: Once | INTRAMUSCULAR | Status: AC
  Administered 2021-05-04: 300 ug via INTRAVENOUS
  Filled 2021-05-04: qty 2

## 2021-05-04 MED ORDER — ONDANSETRON HCL 4 MG PO TABS: 4.0000 mg | ORAL_TABLET | ORAL | Status: DC | PRN

## 2021-05-04 MED ORDER — WITCH HAZEL-GLYCERIN EX PADS: 1.0000 "application " | MEDICATED_PAD | CUTANEOUS | Status: DC | PRN

## 2021-05-04 MED ORDER — BENZOCAINE-MENTHOL 20-0.5 % EX AERO: 1.0000 "application " | INHALATION_SPRAY | CUTANEOUS | Status: DC | PRN

## 2021-05-04 MED ORDER — ACETAMINOPHEN 325 MG PO TABS: 650.0000 mg | ORAL_TABLET | ORAL | Status: DC | PRN

## 2021-05-04 MED ORDER — ONDANSETRON HCL 4 MG/2ML IJ SOLN: 4.0000 mg | INTRAMUSCULAR | Status: DC | PRN

## 2021-05-04 MED ORDER — DIPHENHYDRAMINE HCL 25 MG PO CAPS
25.0000 mg | ORAL_CAPSULE | Freq: Four times a day (QID) | ORAL | Status: DC | PRN
Start: 2021-05-04 — End: 2021-05-05

## 2021-05-04 MED ORDER — IBUPROFEN 600 MG PO TABS
600.0000 mg | ORAL_TABLET | Freq: Four times a day (QID) | ORAL | Status: DC
  Administered 2021-05-04 – 2021-05-05 (×5): 600 mg via ORAL
  Filled 2021-05-04 (×5): qty 1

## 2021-05-04 MED ORDER — SIMETHICONE 80 MG PO CHEW: 80.0000 mg | CHEWABLE_TABLET | ORAL | Status: DC | PRN

## 2021-05-04 MED ORDER — PROPRANOLOL HCL 10 MG PO TABS
10.0000 mg | ORAL_TABLET | Freq: Two times a day (BID) | ORAL | Status: DC
  Administered 2021-05-04 – 2021-05-05 (×3): 10 mg via ORAL
  Filled 2021-05-04 (×4): qty 1

## 2021-05-04 MED ORDER — COCONUT OIL OIL: 1.0000 "application " | TOPICAL_OIL | Status: DC | PRN

## 2021-05-04 MED ORDER — TETANUS-DIPHTH-ACELL PERTUSSIS 5-2.5-18.5 LF-MCG/0.5 IM SUSY: 0.5000 mL | PREFILLED_SYRINGE | Freq: Once | INTRAMUSCULAR | Status: DC

## 2021-05-04 MED ORDER — ZOLPIDEM TARTRATE 5 MG PO TABS: 5.0000 mg | ORAL_TABLET | Freq: Every evening | ORAL | Status: DC | PRN

## 2021-05-04 NOTE — Lactation Note (Signed)
This note was copied from a baby's chart. ?Lactation Consultation Note ? ?Patient Name: Jenny Clark ?Today's Date: 05/04/2021 ?Reason for consult: L&D Initial assessment;1st time breastfeeding;Term ?Age:22 hours ?P1, term female infant. ?Mom was doing skin to skin when LC entered the room. ?LC ask mom to do breast stimulation to help evert nipple shaft out more prior to latching infant at the breast. ?Mom latched infant on her right breast using the cross cradle hold, infant was on and off the breast for 7 minutes, mom switched to left breast using the football hold, infant started sustaining latch infant BF for a total of 15 minutes. ?Mom could benefit from a hand pump to pre-pump breast prior to latching infant at the breast and breast shells to wear in bra when not sleeping. ?Mom knows to BF infant on demand, according to hunger cues, 8 to 12 times within 24 hours, skin to skin. ?Mom knows to continue to ask RN/LC for latch assistance on MBU if needed. ?LC congratulated parents on the birth of their daughter.  ?Maternal Data ?  ? ?Feeding ?Mother's Current Feeding Choice: Breast Milk ? ?LATCH Score ?Latch: Repeated attempts needed to sustain latch, nipple held in mouth throughout feeding, stimulation needed to elicit sucking reflex. ? ?Audible Swallowing: Spontaneous and intermittent ? ?Type of Nipple: Flat ? ?Comfort (Breast/Nipple): Soft / non-tender ? ?Hold (Positioning): Full assist, staff holds infant at breast ? ?LATCH Score: 6 ? ? ?Lactation Tools Discussed/Used ?  ? ?Interventions ?Interventions: Assisted with latch;Skin to skin;Breast compression;Adjust position;Support pillows;Position options;Education ? ?Discharge ?  ? ?Consult Status ?Consult Status: Follow-up from L&D ? ? ? ?Danelle Earthly ?05/04/2021, 12:12 AM ? ? ? ?

## 2021-05-04 NOTE — Lactation Note (Signed)
This note was copied from a baby's chart. ?Lactation Consultation Note ? ?Patient Name: [name] ?Today's Date: 05/04/2021 ?Reason for consult: Initial assessment;Primapara;1st time breastfeeding ?Age:22 hours ? ? ?P1 mother whose infant is now 6 hours old.  This is a term baby at 39+0 weeks.  Mother's current feeding preference is breast. ? ?Baby "Lila" was swaddled and asleep in father's arms when I arrived.  Reviewed breast feeding basics with family.  Mother has been able to hand express colostrum.  Discussed finger feeding/spoon feeding any drops obtained to "Lila."  Encouraged to feed 8-12 times/24 hours or sooner if baby shows cues.  Offered to return for latch assistance as needed.  Mother appreciative. ? ? ?Maternal Data ?Has patient been taught Hand Expression?: Yes ?Does the patient have breastfeeding experience prior to this delivery?: No ? ?Feeding ?Mother's Current Feeding Choice: Breast Milk ? ?LATCH Score ?Latch: Repeated attempts needed to sustain latch, nipple held in mouth throughout feeding, stimulation needed to elicit sucking reflex. ? ?Audible Swallowing: Spontaneous and intermittent ? ?Type of Nipple: Everted at rest and after stimulation ? ?Comfort (Breast/Nipple): Soft / non-tender ? ?Hold (Positioning): Assistance needed to correctly position infant at breast and maintain latch. ? ?LATCH Score: 8 ? ? ?Lactation Tools Discussed/Used ?  ? ?Interventions ?Interventions: Breast feeding basics reviewed;Education;LC Services brochure ? ?Discharge ?Pump: Personal;Hands Free ?WIC Program: No ? ?Consult Status ?Consult Status: Follow-up ?Date: 05/05/21 ?Follow-up type: In-patient ? ? ? ?Mauri Tolen R Daleisa Halperin ?05/04/2021, 11:27 AM ? ? ? ?

## 2021-05-04 NOTE — Anesthesia Postprocedure Evaluation (Signed)
Anesthesia Post Note ? ?Patient: Jenny Clark ? ?Procedure(s) Performed: AN AD HOC LABOR EPIDURAL ? ?  ? ?Patient location during evaluation: Mother Baby ?Anesthesia Type: Epidural ?Level of consciousness: awake and alert and oriented ?Pain management: satisfactory to patient ?Vital Signs Assessment: post-procedure vital signs reviewed and stable ?Respiratory status: respiratory function stable ?Cardiovascular status: stable ?Postop Assessment: no headache, no backache, epidural receding, patient able to bend at knees, no signs of nausea or vomiting, adequate PO intake and able to ambulate ?Anesthetic complications: no ? ? ?No notable events documented. ? ?Last Vitals:  ?Vitals:  ? 05/04/21 0223 05/04/21 0612  ?BP: 116/73 101/63  ?Pulse: 93 91  ?Resp: 17 16  ?Temp: 37.4 ?C 37.1 ?C  ?SpO2: 98% 98%  ?  ?Last Pain:  ?Vitals:  ? 05/04/21 0612  ?TempSrc: Oral  ?PainSc: 3   ? ?Pain Goal: Patients Stated Pain Goal: 0 (05/03/21 1152) ? ?  ?  ?  ?  ?  ?  ?  ? ?Manie Bealer ? ? ? ? ?

## 2021-05-04 NOTE — Progress Notes (Signed)
Post Partum Day 1 ?Subjective: ?Doing well without complaints. Ambulating, voiding, tolerating po. Breastfeeding. Minimal lochia ? ?Objective: ?Patient Vitals for the past 24 hrs: ? BP Temp Temp src Pulse Resp SpO2  ?05/04/21 1119 (!) 111/96 98.6 ?F (37 ?C) -- 85 17 98 %  ?05/04/21 0612 101/63 98.8 ?F (37.1 ?C) Oral 91 16 98 %  ?05/04/21 0223 116/73 99.3 ?F (37.4 ?C) Oral 93 17 98 %  ?05/04/21 0112 114/78 99.3 ?F (37.4 ?C) Oral 89 16 98 %  ?05/04/21 0046 (!) 116/57 -- -- 89 -- --  ?05/04/21 0031 129/76 -- -- 89 -- --  ?05/04/21 0015 126/70 -- -- 93 -- --  ?05/03/21 2345 117/70 -- -- 98 15 --  ?05/03/21 2145 111/74 98.7 ?F (37.1 ?C) Oral (!) 103 -- --  ?05/03/21 2017 -- -- -- (!) 103 -- 99 %  ?05/03/21 2002 95/69 -- -- (!) 101 18 --  ?05/03/21 1900 114/77 -- -- 95 18 --  ?05/03/21 1831 108/80 -- -- 89 18 --  ?05/03/21 1801 (!) 105/57 98.4 ?F (36.9 ?C) Oral 74 18 --  ?05/03/21 1730 125/80 -- -- 72 18 --  ?05/03/21 1701 130/84 -- -- 82 18 --  ?05/03/21 1631 112/77 -- -- 73 16 --  ?05/03/21 1616 101/60 98.4 ?F (36.9 ?C) Oral (!) 105 18 --  ?05/03/21 1601 (!) 104/56 -- -- 79 18 --  ?05/03/21 1546 (!) 101/53 -- -- 89 16 --  ?05/03/21 1531 113/67 -- -- 90 20 --  ?05/03/21 1516 111/63 -- -- 87 18 --  ?05/03/21 1501 111/62 -- -- 82 18 --  ?05/03/21 1459 116/60 -- -- 83 18 --  ?05/03/21 1446 122/83 -- -- 79 18 --  ?05/03/21 1430 114/71 -- -- 90 16 --  ?05/03/21 1415 113/84 -- -- 84 18 --  ?05/03/21 1400 (!) 103/48 -- -- 84 18 99 %  ?05/03/21 1355 (!) 105/57 -- -- 85 18 99 %  ?05/03/21 1342 (!) 103/52 -- -- 79 18 --  ?05/03/21 1331 95/69 -- -- 83 18 --  ?05/03/21 1326 94/79 -- -- (!) 122 18 --  ?05/03/21 1321 (!) 110/56 -- -- 90 18 --  ?05/03/21 1315 108/63 -- -- 84 -- --  ?05/03/21 1313 108/64 -- -- 87 -- --  ?05/03/21 1311 108/65 -- -- 79 -- --  ?05/03/21 1309 91/61 -- -- 95 -- --  ?05/03/21 1307 (!) 97/54 -- -- 78 -- --  ?05/03/21 1305 111/61 -- -- 81 18 --  ?05/03/21 1303 (!) 94/59 -- -- (!) 54 18 --  ?05/03/21 1301 (!)  93/46 -- -- 76 18 --  ?05/03/21 1300 (!) 99/55 -- -- 69 18 --  ?05/03/21 1258 (!) 84/48 -- -- 76 18 --  ?05/03/21 1254 (!) 98/50 -- -- 65 18 --  ?05/03/21 1252 (!) 90/44 -- -- 73 18 --  ?05/03/21 1250 113/62 -- -- 76 18 --  ?05/03/21 1246 113/63 -- -- 78 18 --  ?05/03/21 1241 107/66 -- -- 87 18 --  ?05/03/21 1236 114/68 -- -- 86 18 --  ?05/03/21 1230 114/61 -- -- 75 18 --  ?05/03/21 1225 111/69 -- -- 87 18 --  ? ? ?Physical Exam:  ?General: alert, cooperative, and no distress ?Lochia: appropriate ?Uterine Fundus: firm ?DVT Evaluation: No evidence of DVT seen on physical exam. ? ?Recent Labs  ?  05/03/21 ?0823 05/04/21 ?0435  ?WBC 13.6* 19.5*  ?HGB 10.3* 9.7*  ?HCT 31.7* 29.5*  ?PLT 268 219  ? ? ?No  results for input(s): NA, K, CL, CO2CT, BUN, CREATININE, GLUCOSE, BILITOT, ALT, AST, ALKPHOS, PROT, ALBUMIN in the last 72 hours. ? ?No results for input(s): CALCIUM, MG, PHOS in the last 72 hours. ? ?No results for input(s): PROTIME, APTT, INR in the last 72 hours. ? ?No results for input(s): PROTIME, APTT, INR, FIBRINOGEN in the last 72 hours. ?Assessment/Plan: ? ?Jenny Clark 21 y.o. [id number] PPD#1 sp SVD ?1. PPC: routine PP care ?2. Rh neg, baby Rh pos, follow up Rhogam ?3. Dispo: anticipate d/c home tomorrow ? ? ? LOS: 1 day  ? ?Charlett Nose ?05/04/2021, 12:20 PM  ? ?

## 2021-05-04 NOTE — Social Work (Signed)
MOB was referred for history of depression/anxiety/bipolar. ?* Referral screened out by Clinical Social Worker because none of the following criteria appear to apply: ?~ History of anxiety/depression during this pregnancy, or of post-partum depression following prior delivery. ?~ Diagnosis of anxiety and/or depression within last 3 years ?OR ?* MOB's symptoms currently being treated with medication and/or therapy. ? ?CSW received and acknowledges consult for EDPS of 9.  Consult screened out due to 9 on EDPS does not warrant a CSW consult.  MOB whom scores are greater than 9/yes to question 10 on Edinburgh Postpartum Depression Screen warrants a CSW consult.  ? ?CSW received consult for hx if substance use. MOB reports she has not used substances since 2017. MOB reports shes in a much better place in life and does not need any resources. MOB reports she discussed previously with her provider that there was an error on her chart stating that her last use was Jan 2023. MOB was told that would be removed.  ? ?Jimmy Picket, LCSW ?Clinical Social Worker ?

## 2021-05-04 NOTE — Social Work (Deleted)
CSW received and acknowledges consult for EDPS of 9.  Consult screened out due to 9 on EDPS does not warrant a CSW consult.  MOB whom scores are greater than 9/yes to question 10 on Edinburgh Postpartum Depression Screen warrants a CSW consult.   Devaeh Amadi, LCSW Clinical Social Worker   

## 2021-05-05 LAB — RH IG WORKUP (INCLUDES ABO/RH)
Fetal Screen: NEGATIVE
Gestational Age(Wks): 39
Unit division: 0

## 2021-05-05 MED ORDER — ACETAMINOPHEN 325 MG PO TABS: 650.0000 mg | ORAL_TABLET | Freq: Four times a day (QID) | ORAL | Status: DC | PRN

## 2021-05-05 MED ORDER — IBUPROFEN 200 MG PO TABS: 600.0000 mg | ORAL_TABLET | Freq: Four times a day (QID) | ORAL | Status: DC | PRN

## 2021-05-05 MED ORDER — DOCUSATE SODIUM 100 MG PO CAPS: 100.0000 mg | ORAL_CAPSULE | Freq: Two times a day (BID) | ORAL | 0 refills | Status: DC

## 2021-05-05 NOTE — Discharge Summary (Signed)
? ?  Postpartum Discharge Summary ? ?Date of Service updated 05/05/21  ? ?   ?Patient Name: Jenny Clark ?DOB: May 26, 2000 ?MRN: [id number] ? ?Date of admission: 05/03/2021 ?Delivery date:05/03/2021  ?Delivering provider: Paula Compton  ?Date of discharge: 05/05/2021 ? ?Admitting diagnosis: Term pregnancy [Z34.90] ?NSVD (normal spontaneous vaginal delivery) [O80] ?Intrauterine pregnancy: [redacted]w[redacted]d    ?Secondary diagnosis:  Principal Problem: ?  Term pregnancy ?Active Problems: ?  NSVD (normal spontaneous vaginal delivery) ? ?Additional problems: Palpitations    ?Discharge diagnosis: Term Pregnancy Delivered                                              ?Post partum procedures:rhogam ?Augmentation: Pitocin and Cytotec ?Complications: None ? ?Hospital course: Induction of Labor With Vaginal Delivery   ?21y.o. yo G2P1011 at 330w0das admitted to the hospital 05/03/2021 for induction of labor.  Indication for induction: Elective.  Patient had an uncomplicated labor course as follows: ?Membrane Rupture Time/Date: 12:13 PM ,05/03/2021   ?Delivery Method:Vaginal, Spontaneous  ?Episiotomy: None  ?Lacerations:  Labial  ?Details of delivery can be found in separate delivery note.  Patient had a routine postpartum course. Patient is discharged home 05/05/21. ? ?Newborn Data: ?Birth date:05/03/2021  ?Birth time:11:25 PM  ?Gender:Female  ?Living status:Living  ?Apgars:8 ,9  ?[id number]:[id number]  ? ?Magnesium Sulfate received: No ?BMZ received: No ?Rhophylac:Yes ?MMR:No ?T-DaP:Given prenatally ?Flu: No ?Transfusion:No ? ?Physical exam  ?Vitals:  ? 05/04/21 1119 05/04/21 1533 05/04/21 2037 05/05/21 0514  ?BP: (!) 111/96 113/64 123/75 110/80  ?Pulse: 85 89 81 71  ?Resp: '17 18 19 18  ' ?Temp: 98.6 ?F (37 ?C) 99 ?F (37.2 ?C) 98.3 ?F (36.8 ?C) (!) 97.1 ?F (36.2 ?C)  ?TempSrc:  Oral Oral Oral  ?SpO2: 98% 98% 98% 100%  ?Weight:      ?Height:      ? ?General: alert, cooperative, and no distress ?Lochia: appropriate ?Uterine Fundus: firm ?Incision:  N/A ?DVT Evaluation: No evidence of DVT seen on physical exam. ?Labs: ?Lab Results  ?Component Value Date  ? WBC 19.5 (H) 05/04/2021  ? HGB 9.7 (L) 05/04/2021  ? HCT 29.5 (L) 05/04/2021  ? MCV 79.5 (L) 05/04/2021  ? PLT 219 05/04/2021  ? ? ?  Latest Ref Rng & Units 12/11/2020  ?  7:08 PM  ?CMP  ?Glucose 70 - 99 mg/dL 74    ?BUN 6 - 20 mg/dL 6    ?Creatinine 0.44 - 1.00 mg/dL 0.52    ?Sodium 135 - 145 mmol/L 135    ?Potassium 3.5 - 5.1 mmol/L 3.8    ?Chloride 98 - 111 mmol/L 103    ?CO2 22 - 32 mmol/L 22    ?Calcium 8.9 - 10.3 mg/dL 9.4    ?Total Protein 6.5 - 8.1 g/dL 7.3    ?Total Bilirubin 0.3 - 1.2 mg/dL 0.2    ?Alkaline Phos 38 - 126 U/L 73    ?AST 15 - 41 U/L 20    ?ALT 0 - 44 U/L 22    ? ?Edinburgh Score: ? ?  05/04/2021  ?  1:32 AM  ?EdFlavia Shipperostnatal Depression Scale Screening Tool  ?I have been able to laugh and see the funny side of things. 0  ?I have looked forward with enjoyment to things. 0  ?I have blamed myself unnecessarily when things went wrong. 1  ?I have  been anxious or worried for no good reason. 3  ?I have felt scared or panicky for no good reason. 3  ?Things have been getting on top of me. 1  ?I have been so unhappy that I have had difficulty sleeping. 0  ?I have felt sad or miserable. 0  ?I have been so unhappy that I have been crying. 1  ?The thought of harming myself has occurred to me. 0  ?Edinburgh Postnatal Depression Scale Total 9  ? ? ? ? ?After visit meds:  ?Allergies as of 05/05/2021   ?No Known Allergies ?  ? ?  ?Medication List  ?  ? ?TAKE these medications   ? ?acetaminophen 325 MG tablet ?Commonly known as: Tylenol ?Take 2 tablets (650 mg total) by mouth every 6 (six) hours as needed (for pain scale < 4). ?  ?docusate sodium 100 MG capsule ?Commonly known as: Colace ?Take 1 capsule (100 mg total) by mouth 2 (two) times daily. ?  ?ibuprofen 200 MG tablet ?Commonly known as: ADVIL ?Take 3 tablets (600 mg total) by mouth every 6 (six) hours as needed. ?  ?multivitamin-prenatal 27-0.8  MG Tabs tablet ?Take 1 tablet by mouth daily at 12 noon. ?  ?propranolol 10 MG tablet ?Commonly known as: INDERAL ?Take 1 tablet (10 mg total) by mouth 2 (two) times daily. ?  ? ?  ? ? ? ?Discharge home in stable condition ?Infant Feeding: Breast ?Infant Disposition:home with mother ?Discharge instruction: per After Visit Summary and Postpartum booklet. ?Activity: Advance as tolerated. Pelvic rest for 6 weeks.  ?Diet: iron rich diet ?Anticipated Birth Control: Unsure ?Postpartum Appointment:6 weeks ?Additional Postpartum F/U:  none ?Future Appointments: ?Future Appointments  ?Date Time Provider [place]  ?05/20/2021  9:40 AM Tobb, Godfrey Pick, DO [facility]  ? ?Follow up Visit: ? Follow-up Information   ? ? [facility]. Schedule an appointment as soon as possible for a visit in 6 week(s).   ?Contact information: ?[address]  ?[address] ?[place] [address] ?[phone] ? ? ?  ?  ? ?  ?  ? ?  ? ? ? ?  ? ?05/05/2021 ?Rowland Lathe, MD ? ? ?

## 2021-05-05 NOTE — Progress Notes (Signed)
Post Partum Day 2 ?Subjective: ?Doing well. Eager for discharge home. Ambulating, voiding, tolerating po. Breastfeeding. Lochia was moderate overnight but back to light this AM ? ?Objective: ?Patient Vitals for the past 24 hrs: ? BP Temp Temp src Pulse Resp SpO2  ?05/05/21 0514 110/80 (!) 97.1 ?F (36.2 ?C) Oral 71 18 100 %  ?05/04/21 2037 123/75 98.3 ?F (36.8 ?C) Oral 81 19 98 %  ?05/04/21 1533 113/64 99 ?F (37.2 ?C) Oral 89 18 98 %  ?05/04/21 1119 (!) 111/96 98.6 ?F (37 ?C) -- 85 17 98 %  ? ? ?Physical Exam:  ?General: alert, cooperative, and no distress ?Lochia: appropriate ?Uterine Fundus: firm ?DVT Evaluation: No evidence of DVT seen on physical exam. ? ?Recent Labs  ?  05/03/21 ?0823 05/04/21 ?0435  ?WBC 13.6* 19.5*  ?HGB 10.3* 9.7*  ?HCT 31.7* 29.5*  ?PLT 268 219  ? ? ?No results for input(s): NA, K, CL, CO2CT, BUN, CREATININE, GLUCOSE, BILITOT, ALT, AST, ALKPHOS, PROT, ALBUMIN in the last 72 hours. ? ?No results for input(s): CALCIUM, MG, PHOS in the last 72 hours. ? ?No results for input(s): PROTIME, APTT, INR in the last 72 hours. ? ?No results for input(s): PROTIME, APTT, INR, FIBRINOGEN in the last 72 hours. ?Assessment/Plan: ? ?Jenny Clark 21 y.o. [id number] PPD#2 sp SVD ?1. PPC: routine PP care ?2. Rh neg, baby Rh pos - s/p Rhogam ?3. Dispo: discharge today. Instructions reviewed ? ? LOS: 2 days  ? ?Charlett Nose ?05/05/2021, 9:56 AM  ? ?

## 2021-05-05 NOTE — Lactation Note (Signed)
This note was copied from a baby's chart. ?Lactation Consultation Note ? ?Patient Name: Jenny Clark ?Today's Date: 05/05/2021 ?Reason for consult: Follow-up assessment ?Age:22 hours ? ?LC in to room prior to discharge. Mother reports good feedings. Talked about nipple care. Discussed normal behavior and patterns after 24h, voids and stools as signs good intake, pumping, clusterfeeding, skin to skin. Talked about milk coming into volume and managing engorgement.  ? ?Plan: ?1-Aim for a deep, comfortable latch, breastfeeding on demand or 8-12 times in 24h period. ?2-Hand express/pump as needed for supplementation ?3-Encouraged maternal rest, hydration and food intake.  ? ?Contact LC as needed for feeds/support/concerns/questions. All questions answered at this time. Reviewed LC brochure.    ? ?Maternal Data ?Has patient been taught Hand Expression?: Yes ?Does the patient have breastfeeding experience prior to this delivery?: No ? ?Feeding ?Mother's Current Feeding Choice: Breast Milk ? ?LATCH Score ?Latch: Grasps breast easily, tongue down, lips flanged, rhythmical sucking. ? ?Audible Swallowing: Spontaneous and intermittent ? ?Type of Nipple: Everted at rest and after stimulation ? ?Comfort (Breast/Nipple): Soft / non-tender ? ?Hold (Positioning): No assistance needed to correctly position infant at breast. ? ?LATCH Score: 10 ? ? ?Lactation Tools Discussed/Used ?Tools: Pump;Flanges ?Flange Size: 21 ?Breast pump type: Manual ? ?Interventions ?Interventions: Breast feeding basics reviewed;Skin to skin;Breast massage;Hand express;Hand pump;Expressed milk;Education;LC Services brochure ? ?Discharge ?Discharge Education: Engorgement and breast care;Warning signs for feeding baby ?Pump: Personal;Manual ?WIC Program: Yes ? ?Consult Status ?Consult Status: Complete ?Date: 05/05/21 ?Follow-up type: Call as needed ? ? ? ?Pearlie Lafosse A Higuera Ancidey ?05/05/2021, 12:21 PM ? ? ? ?

## 2021-05-06 DIAGNOSIS — Z419 Encounter for procedure for purposes other than remedying health state, unspecified: Secondary | ICD-10-CM | POA: Diagnosis not present

## 2021-05-11 ENCOUNTER — Telehealth (HOSPITAL_COMMUNITY): Payer: Self-pay

## 2021-05-11 ENCOUNTER — Telehealth (HOSPITAL_COMMUNITY): Payer: Self-pay | Admitting: *Deleted

## 2021-05-11 NOTE — Telephone Encounter (Signed)
Patient voiced no questions or concerns at this time. EPDS=10. Patient stated that she has seen a psychiatrist in the past. Stated, "I was planning to call, because I've noticed my anxiety increasing over the past couple of days." RN praised patient for being aware of her feelings. Encouraged patient to contact the office where she has been seen on Monday. Patient verbalized understanding. EPDS results faxed to [facility] provider, Dr. Huel Cote. ?Patient voiced no questions or concerns regarding infant at this time. Patient reports infant sleeps in a bassinet on her back. RN reviewed ABCs of safe sleep. Patient verbalized understanding. Patient requested RN email information on hospital's virtual postpartum classes and support groups. Also requested RN email list of maternal mental health resources. Email sent. Deforest Hoyles, RN, 05/11/21, 1023  ?

## 2021-05-11 NOTE — Telephone Encounter (Signed)
D/C follow up call already completed. No call needed at this time.  ? ?Signe Colt ?05/11/21,1756 ?

## 2021-05-20 ENCOUNTER — Ambulatory Visit (INDEPENDENT_AMBULATORY_CARE_PROVIDER_SITE_OTHER): Payer: Managed Care, Other (non HMO) | Admitting: Cardiology

## 2021-05-20 VITALS — BP 118/88 | HR 133 | Ht 65.0 in

## 2021-05-20 DIAGNOSIS — E669 Obesity, unspecified: Secondary | ICD-10-CM | POA: Diagnosis not present

## 2021-05-20 DIAGNOSIS — R002 Palpitations: Secondary | ICD-10-CM | POA: Diagnosis not present

## 2021-05-20 NOTE — Patient Instructions (Signed)

## 2021-05-20 NOTE — Progress Notes (Signed)
?[facility] ? ?Follow Up Note ? ? ?Date:  05/20/2021  ? ?ID:  Jenny Clark, DOB 06/15/2000, MRN [id number] ? ?PCP:  Marva Panda, NP ?  ?[facility] Providers ?Cardiologist:  Thomasene Ripple, DO  ?Electrophysiologist:  None      ? ? ?Referring MD: Marva Panda, NP  ? ?Chief Complaint: " I am doing fine" ? ?Virtual Visit via Video  Note . I connected with the patient today by a   video enabled telemedicine application and verified that I am speaking with the correct person using two identifiers. ? ?The patient is at home. ?I am in the office. ? ?Jenny Clark is a 21 y.o. female [G2P1011] who returns for follow up of postpartum cardiovascular care. ? ?At her last visit on February 28, 2021 she still is experiencing palpitations but they have greatly improved on the propanolol.  We made no changes to her medication. ? ?Since I saw the patient she has delivered her daughter.  She is very happy. ? ? ?Prior CV Studies Reviewed: ?The following studies were reviewed today: ? ?TTE 02/06/2021 IMPRESSIONS  ? 1. Left ventricular ejection fraction, by estimation, is 60 to 65%. The left ventricle has normal function. The left ventricle has no regional  ?wall motion abnormalities. Left ventricular diastolic parameters were normal.  ? 2. Right ventricular systolic function is normal. The right ventricular size is normal. Tricuspid regurgitation signal is inadequate for assessing  ?PA pressure.  ? 3. The pericardial effusion is anterior to the right ventricle.  4. The mitral valve is normal in structure. Trivial mitral valve  ?regurgitation. No evidence of mitral stenosis.  ? 5. The aortic valve is normal in structure. Aortic valve regurgitation is not visualized. No aortic stenosis is present.  ? 6. The inferior vena cava is normal in size with greater than 50% respiratory variability, suggesting right atrial pressure of 3 mmHg.  ? ?FINDINGS  ? Left Ventricle: Left ventricular ejection fraction, by  estimation, is 60 to 65%. The left ventricle has normal function. The left ventricle has no  ?regional wall motion abnormalities. The left ventricular internal cavity  ?size was normal in size. There is  ? no left ventricular hypertrophy. Left ventricular diastolic parameters  ?were normal. Normal left ventricular filling pressure.  ? ?Right Ventricle: The right ventricular size is normal. No increase in  ?right ventricular wall thickness. Right ventricular systolic function is  ?normal. Tricuspid regurgitation signal is inadequate for assessing PA  ?pressure.  ? ?Left Atrium: Left atrial size was normal in size.  ? ?Right Atrium: Right atrial size was normal in size.  ? ?Pericardium: Trivial pericardial effusion is present. The pericardial  ?effusion is anterior to the right ventricle.  ? ?Mitral Valve: The mitral valve is normal in structure. Trivial mitral  ?valve regurgitation. No evidence of mitral valve stenosis.  ? ?Tricuspid Valve: The tricuspid valve is normal in structure. Tricuspid  ?valve regurgitation is trivial. No evidence of tricuspid stenosis.  ? ?Aortic Valve: The aortic valve is normal in structure. Aortic valve  ?regurgitation is not visualized. No aortic stenosis is present.  ? ?Pulmonic Valve: The pulmonic valve was normal in structure. Pulmonic valve  ?regurgitation is mild. No evidence of pulmonic stenosis.  ? ?Aorta: The aortic root is normal in size and structure.  ? ?Venous: The inferior vena cava is normal in size with greater than 50%  ?respiratory variability, suggesting right atrial pressure of 3 mmHg.  ? ?IAS/Shunts: No atrial level shunt detected  by color flow Doppler.  ? ?Past Medical History:  ?Diagnosis Date  ? Anxiety   ? Bipolar disorder (HCC)   ? not fully diagnosed  ? Bronchitis   ? Depression   ? Drug abuse (HCC)   ? meth, cocaine,molly, marijuana  ? Headache   ? migraines  ? Missed abortion 06/17/2017  ? Plica syndrome of left knee 01/2014  ? Pneumonia   ? as a child  ?  Tachycardia   ? ? ?Past Surgical History:  ?Procedure Laterality Date  ? DILATION AND EVACUATION N/A 06/19/2017  ? Procedure: DILATATION AND EVACUATION;  Surgeon: Sherian ReinBovard-Stuckert, Jody, MD;  Location: [facility];  Service: Gynecology;  Laterality: N/A;  ? KNEE ARTHROSCOPY Left 01/23/2014  ? Procedure: LEFT KNEE SCOPE WITH PLICA EXCISION;  Surgeon: Thera FlakeW D Caffrey Jr., MD;  Location: [facility];  Service: Orthopedics;  Laterality: Left;  ? WISDOM TOOTH EXTRACTION  01/10/2014  ?   ? ?OB History   ? ? Gravida  ?2  ? Para  ?1  ? Term  ?1  ? Preterm  ?0  ? AB  ?1  ? Living  ?1  ?  ? ? SAB  ?1  ? IAB  ?   ? Ectopic  ?   ? Multiple  ?0  ? Live Births  ?1  ?   ?  ?  ?    ? ? ?Current Medications: ?Current Meds  ?Medication Sig  ? acetaminophen (TYLENOL) 325 MG tablet Take 2 tablets (650 mg total) by mouth every 6 (six) hours as needed (for pain scale < 4).  ? ibuprofen (ADVIL) 200 MG tablet Take 3 tablets (600 mg total) by mouth every 6 (six) hours as needed.  ? propranolol (INDERAL) 10 MG tablet Take 1 tablet (10 mg total) by mouth 2 (two) times daily.  ? [DISCONTINUED] docusate sodium (COLACE) 100 MG capsule Take 1 capsule (100 mg total) by mouth 2 (two) times daily.  ? [DISCONTINUED] Prenatal Vit-Fe Fumarate-FA (MULTIVITAMIN-PRENATAL) 27-0.8 MG TABS tablet Take 1 tablet by mouth daily at 12 noon.  ?  ? ?Allergies:   Patient has no known allergies.  ? ?Social History  ? ?Socioeconomic History  ? Marital status: Single  ?  Spouse name: Not on file  ? Number of children: 0  ? Years of education: 3414  ? Highest education level: Not on file  ?Occupational History  ? Occupation: [employer]  ?Tobacco Use  ? Smoking status: Former  ?  Packs/day: 0.50  ?  Years: 4.00  ?  Pack years: 2.00  ?  Types: Cigarettes  ? Smokeless tobacco: Former  ?Vaping Use  ? Vaping Use: Never used  ?Substance and Sexual Activity  ? Alcohol use: Not Currently  ?  Comment: occas  ? Drug use: Not Currently  ?  Types: Benzodiazepines,  Cocaine, Amphetamines, Marijuana  ?  Comment: last used 2017  ? Sexual activity: Not Currently  ?  Comment: offered condoms  ?Other Topics Concern  ? Not on file  ?Social History Narrative  ? Lives with aunt  ? Caffeine use: daily  ? Right handed   ? ?Social Determinants of Health  ? ?Financial Resource Strain: Not on file  ?Food Insecurity: Not on file  ?Transportation Needs: Not on file  ?Physical Activity: Not on file  ?Stress: Not on file  ?Social Connections: Not on file  ?  ? ? ?Family History  ?Problem Relation Age of Onset  ? Other  Maternal Grandmother   ?     cerebral amyloiod angiopathy  ? Breast cancer Maternal Grandmother   ? Hypertension Maternal Grandfather   ?   ? ?ROS:   ?Please see the history of present illness.    ? ?All other systems reviewed and are negative. ? ? ?Labs/EKG Reviewed:   ? ?EKG:   ?EKG is was not  ordered today.   ? ?Recent Labs: ?12/11/2020: ALT 22; BUN 6; Creatinine, Ser 0.52; Potassium 3.8; Sodium 135 ?05/04/2021: Hemoglobin 9.7; Platelets 219  ? ?Recent Lipid Panel ?Lab Results  ?Component Value Date/Time  ? CHOL 174 04/12/2018 06:32 AM  ? TRIG 91 04/12/2018 06:32 AM  ? HDL 63 04/12/2018 06:32 AM  ? CHOLHDL 2.8 04/12/2018 06:32 AM  ? LDLCALC 93 04/12/2018 06:32 AM  ? ? ?Physical Exam:   ? ?VS:  BP 118/88   Pulse (!) 133   Ht 5\' 5"  (1.651 m)   BMI 35.91 kg/m?    ? ?Wt Readings from Last 3 Encounters:  ?05/03/21 215 lb 12.8 oz (97.9 kg)  ?02/28/21 192 lb 12.8 oz (87.5 kg)  ?02/05/21 193 lb 6.4 oz (87.7 kg)  ?  ? ?Virtual visit no physical exam. ? ? ?Risk Assessment/Risk Calculators:   ? ?  ?  ?   ?  ?  ? ? ?ASSESSMENT & PLAN:   ? ?Palpitations-she continues to take her propanolol.  No changes with the medication today. ?No recurrent syncope-no need for any further work-up. ?The patient understands the need to lose weight with diet and exercise. We have discussed specific strategies for this. ? ?Total time spent 10 minutes. ?The patient is in agreement with the above plan. The  patient left the office in stable condition.  The patient will follow up in 6 months or sooner if needed. ? ? ?Patient Instructions  ?Medication Instructions:  ?Your physician recommends that you continue on your curr

## 2021-06-06 DIAGNOSIS — Z419 Encounter for procedure for purposes other than remedying health state, unspecified: Secondary | ICD-10-CM | POA: Diagnosis not present

## 2021-07-06 DIAGNOSIS — Z419 Encounter for procedure for purposes other than remedying health state, unspecified: Secondary | ICD-10-CM | POA: Diagnosis not present

## 2021-08-06 DIAGNOSIS — Z419 Encounter for procedure for purposes other than remedying health state, unspecified: Secondary | ICD-10-CM | POA: Diagnosis not present

## 2021-09-06 DIAGNOSIS — Z419 Encounter for procedure for purposes other than remedying health state, unspecified: Secondary | ICD-10-CM | POA: Diagnosis not present

## 2021-09-12 ENCOUNTER — Telehealth (HOSPITAL_BASED_OUTPATIENT_CLINIC_OR_DEPARTMENT_OTHER): Payer: Medicaid Other | Admitting: Psychiatry

## 2021-09-12 ENCOUNTER — Encounter (HOSPITAL_COMMUNITY): Payer: Self-pay | Admitting: Psychiatry

## 2021-09-12 DIAGNOSIS — F603 Borderline personality disorder: Secondary | ICD-10-CM | POA: Diagnosis not present

## 2021-09-12 DIAGNOSIS — F4310 Post-traumatic stress disorder, unspecified: Secondary | ICD-10-CM | POA: Diagnosis not present

## 2021-09-12 DIAGNOSIS — F330 Major depressive disorder, recurrent, mild: Secondary | ICD-10-CM

## 2021-09-12 MED ORDER — FLUOXETINE HCL 20 MG PO CAPS: 20.0000 mg | ORAL_CAPSULE | Freq: Every day | ORAL | 2 refills | Status: DC

## 2021-09-12 MED ORDER — LAMOTRIGINE 25 MG PO TABS: ORAL_TABLET | ORAL | 0 refills | Status: DC

## 2021-09-12 MED ORDER — PROPRANOLOL HCL 20 MG PO TABS: 20.0000 mg | ORAL_TABLET | Freq: Two times a day (BID) | ORAL | 1 refills | Status: DC
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
## 2021-09-21 ENCOUNTER — Other Ambulatory Visit (HOSPITAL_COMMUNITY): Payer: Self-pay | Admitting: Psychiatry

## 2021-09-21 DIAGNOSIS — F4310 Post-traumatic stress disorder, unspecified: Secondary | ICD-10-CM

## 2021-10-06 ENCOUNTER — Other Ambulatory Visit (HOSPITAL_COMMUNITY): Payer: Self-pay | Admitting: Psychiatry

## 2021-10-06 DIAGNOSIS — Z419 Encounter for procedure for purposes other than remedying health state, unspecified: Secondary | ICD-10-CM | POA: Diagnosis not present

## 2021-10-06 DIAGNOSIS — F603 Borderline personality disorder: Secondary | ICD-10-CM

## 2021-10-06 DIAGNOSIS — F330 Major depressive disorder, recurrent, mild: Secondary | ICD-10-CM

## 2021-10-07 ENCOUNTER — Other Ambulatory Visit (HOSPITAL_COMMUNITY): Payer: Self-pay | Admitting: *Deleted

## 2021-10-17 ENCOUNTER — Encounter (HOSPITAL_COMMUNITY): Payer: Self-pay | Admitting: Psychiatry

## 2021-10-17 ENCOUNTER — Telehealth (HOSPITAL_BASED_OUTPATIENT_CLINIC_OR_DEPARTMENT_OTHER): Payer: PRIVATE HEALTH INSURANCE | Admitting: Psychiatry

## 2021-10-17 DIAGNOSIS — F603 Borderline personality disorder: Secondary | ICD-10-CM | POA: Diagnosis not present

## 2021-10-17 DIAGNOSIS — F4310 Post-traumatic stress disorder, unspecified: Secondary | ICD-10-CM | POA: Diagnosis not present

## 2021-10-17 DIAGNOSIS — F330 Major depressive disorder, recurrent, mild: Secondary | ICD-10-CM | POA: Diagnosis not present

## 2021-10-17 MED ORDER — LAMOTRIGINE 25 MG PO TABS: 50.0000 mg | ORAL_TABLET | Freq: Every day | ORAL | 1 refills | Status: DC

## 2021-10-17 MED ORDER — PROPRANOLOL HCL 20 MG PO TABS: 20.0000 mg | ORAL_TABLET | Freq: Two times a day (BID) | ORAL | 1 refills | Status: DC

## 2021-10-17 MED ORDER — FLUOXETINE HCL 20 MG PO CAPS: 20.0000 mg | ORAL_CAPSULE | Freq: Every day | ORAL | 1 refills | Status: DC
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
## 2021-11-06 DIAGNOSIS — Z419 Encounter for procedure for purposes other than remedying health state, unspecified: Secondary | ICD-10-CM | POA: Diagnosis not present

## 2021-11-08 ENCOUNTER — Other Ambulatory Visit: Payer: Self-pay

## 2021-11-08 ENCOUNTER — Emergency Department (HOSPITAL_BASED_OUTPATIENT_CLINIC_OR_DEPARTMENT_OTHER): Payer: Managed Care, Other (non HMO) | Admitting: Radiology

## 2021-11-08 ENCOUNTER — Encounter (HOSPITAL_BASED_OUTPATIENT_CLINIC_OR_DEPARTMENT_OTHER): Payer: Self-pay | Admitting: Emergency Medicine

## 2021-11-08 ENCOUNTER — Emergency Department (HOSPITAL_BASED_OUTPATIENT_CLINIC_OR_DEPARTMENT_OTHER)
Admission: EM | Admit: 2021-11-08 | Discharge: 2021-11-09 | Disposition: A | Discharge status: Home / Self Care | Payer: Managed Care, Other (non HMO) | Attending: Emergency Medicine | Admitting: Emergency Medicine

## 2021-11-08 DIAGNOSIS — R002 Palpitations: Secondary | ICD-10-CM | POA: Insufficient documentation

## 2021-11-08 DIAGNOSIS — F17200 Nicotine dependence, unspecified, uncomplicated: Secondary | ICD-10-CM | POA: Insufficient documentation

## 2021-11-08 DIAGNOSIS — F410 Panic disorder [episodic paroxysmal anxiety] without agoraphobia: Secondary | ICD-10-CM | POA: Diagnosis not present

## 2021-11-08 DIAGNOSIS — R079 Chest pain, unspecified: Secondary | ICD-10-CM | POA: Insufficient documentation

## 2021-11-08 DIAGNOSIS — E876 Hypokalemia: Secondary | ICD-10-CM | POA: Diagnosis not present

## 2021-11-08 DIAGNOSIS — R0789 Other chest pain: Secondary | ICD-10-CM | POA: Diagnosis not present

## 2021-11-08 LAB — BASIC METABOLIC PANEL
Anion gap: 19 — ABNORMAL HIGH (ref 5–15)
BUN: 10 mg/dL (ref 6–20)
CO2: 12 mmol/L — ABNORMAL LOW (ref 22–32)
Calcium: 9.9 mg/dL (ref 8.9–10.3)
Chloride: 102 mmol/L (ref 98–111)
Creatinine, Ser: 0.71 mg/dL (ref 0.44–1.00)
GFR, Estimated: >60 mL/min (ref >60)
Glucose, Bld: 110 mg/dL — ABNORMAL HIGH (ref 70–99)
Potassium: 3.1 mmol/L — ABNORMAL LOW (ref 3.5–5.1)
Sodium: 133 mmol/L — ABNORMAL LOW (ref 135–145)

## 2021-11-08 LAB — TROPONIN I (HIGH SENSITIVITY): Troponin I (High Sensitivity): <2 ng/L (ref <18)

## 2021-11-08 LAB — CBC
HCT: 39.3 % (ref 36.0–46.0)
Hemoglobin: 13 g/dL (ref 12.0–15.0)
MCH: 27 pg (ref 26.0–34.0)
MCHC: 33.1 g/dL (ref 30.0–36.0)
MCV: 81.7 fL (ref 80.0–100.0)
Platelets: 302 10*3/uL (ref 150–400)
RBC: 4.81 MIL/uL (ref 3.87–5.11)
RDW: 14.5 % (ref 11.5–15.5)
WBC: 14.3 10*3/uL — ABNORMAL HIGH (ref 4.0–10.5)
nRBC: 0 % (ref 0.0–0.2)

## 2021-11-08 MED ORDER — POTASSIUM CHLORIDE CRYS ER 20 MEQ PO TBCR
40.0000 meq | EXTENDED_RELEASE_TABLET | Freq: Once | ORAL | Status: AC
  Administered 2021-11-08: 40 meq via ORAL
  Filled 2021-11-08: qty 2

## 2021-11-08 MED ORDER — LACTATED RINGERS IV BOLUS
1000.0000 mL | Freq: Once | INTRAVENOUS | Status: AC
  Administered 2021-11-09: 1000 mL via INTRAVENOUS

## 2021-11-08 MED ORDER — ASPIRIN 81 MG PO CHEW
324.0000 mg | CHEWABLE_TABLET | Freq: Once | ORAL | Status: AC
  Administered 2021-11-08: 324 mg via ORAL
  Filled 2021-11-08: qty 4

## 2021-11-08 MED ORDER — LORAZEPAM 2 MG/ML IJ SOLN
1.0000 mg | Freq: Once | INTRAMUSCULAR | Status: AC
  Administered 2021-11-08: 1 mg via INTRAVENOUS
  Filled 2021-11-08: qty 1

## 2021-11-08 NOTE — ED Notes (Signed)
Patient transported to X-ray 

## 2021-11-08 NOTE — ED Triage Notes (Signed)
Patient c/o chest pain that started approx 10 mins ago.  Patient hyperventilating in triage.  Patient takes propanolol and last dose was this AM.
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
## 2021-11-09 DIAGNOSIS — R079 Chest pain, unspecified: Secondary | ICD-10-CM | POA: Diagnosis not present

## 2021-11-09 LAB — BASIC METABOLIC PANEL
Anion gap: 11 (ref 5–15)
BUN: 8 mg/dL (ref 6–20)
CO2: 24 mmol/L (ref 22–32)
Calcium: 9.4 mg/dL (ref 8.9–10.3)
Chloride: 104 mmol/L (ref 98–111)
Creatinine, Ser: 0.73 mg/dL (ref 0.44–1.00)
GFR, Estimated: >60 mL/min (ref >60)
Glucose, Bld: 84 mg/dL (ref 70–99)
Potassium: 3.8 mmol/L (ref 3.5–5.1)
Sodium: 139 mmol/L (ref 135–145)

## 2021-11-09 LAB — URINALYSIS, ROUTINE W REFLEX MICROSCOPIC
Bilirubin Urine: NEGATIVE
Glucose, UA: NEGATIVE mg/dL
Hgb urine dipstick: NEGATIVE
Ketones, ur: NEGATIVE mg/dL
Leukocytes,Ua: NEGATIVE
Nitrite: NEGATIVE
Protein, ur: NEGATIVE mg/dL
Specific Gravity, Urine: 1.005 (ref 1.005–1.030)
pH: 6 (ref 5.0–8.0)

## 2021-11-09 LAB — HEPATIC FUNCTION PANEL
ALT: 22 U/L (ref 0–44)
AST: 26 U/L (ref 15–41)
Albumin: 4.3 g/dL (ref 3.5–5.0)
Alkaline Phosphatase: 100 U/L (ref 38–126)
Bilirubin, Direct: 0.1 mg/dL (ref 0.0–0.2)
Indirect Bilirubin: 0.2 mg/dL — ABNORMAL LOW (ref 0.3–0.9)
Total Bilirubin: 0.3 mg/dL (ref 0.3–1.2)
Total Protein: 8.2 g/dL — ABNORMAL HIGH (ref 6.5–8.1)

## 2021-11-09 LAB — ETHANOL: Alcohol, Ethyl (B): 71 mg/dL — ABNORMAL HIGH (ref <10)

## 2021-11-09 LAB — PREGNANCY, URINE: Preg Test, Ur: NEGATIVE

## 2021-11-09 LAB — D-DIMER, QUANTITATIVE: D-Dimer, Quant: 0.3 ug/mL-FEU (ref 0.00–0.50)

## 2021-11-09 LAB — TROPONIN I (HIGH SENSITIVITY): Troponin I (High Sensitivity): <2 ng/L (ref <18)

## 2021-11-09 NOTE — ED Notes (Signed)
ED Provider at bedside. 
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
## 2021-11-09 NOTE — Discharge Instructions (Signed)
Return if you are having any problems. 

## 2021-11-09 NOTE — ED Notes (Signed)
Pt agreeable with d/c plan as discussed by provider- this nurse has verbally reinforced d/c instructions and provided pt with written copy - pt acknowledges verbal understanding and denies any addl questions concerns needs- pt escorted to checkout window via w/c by this nurse with significant other at pt side who will accompany pt home

## 2021-11-11 ENCOUNTER — Telehealth: Payer: Self-pay | Admitting: Cardiology

## 2021-11-11 NOTE — Telephone Encounter (Signed)
Pt c/o of Chest Pain: STAT if CP now or developed within 24 hours  1. Are you having CP right now?   Soreness in chest, achy chest  2. Are you experiencing any other symptoms (ex. SOB, nausea, vomiting, sweating)?   Patient stated had SOB on Friday  3. How long have you been experiencing CP?   Since Friday evening  4. Is your CP continuous or coming and going?   Continuous  5. Have you taken Nitroglycerin?   No ?  Patient stated she was having really bad pains in her chest on Friday night and had gone to the ED.  Patient stated she had missed her dosage of  propranolol (INDERAL) 20 MG tablet while she was in the ED.

## 2021-11-11 NOTE — Telephone Encounter (Signed)
Patient stated that on Friday night, she took her dose of Lamictal and had 2-3 alcoholic beverages. Around 10 pm, she felt her chest tighten and wanted to drive home. She felt like a knife was in her chest and had SOB. She pulled her care over and has presyncope/syncope. Her legs were numb and her body was floppy. Her vision was in and out.Her friends took her to the ED for treatment. She was DX with panic attack and nonspecific chest pain. She stated she felt as though her concerns were disregarded while in the ED. Appointment made with Dr. Harriet Masson for 11/7.

## 2021-11-11 NOTE — Telephone Encounter (Signed)
Mailbox is full and cannot receive messages at this time

## 2021-11-12 ENCOUNTER — Encounter: Payer: Self-pay | Admitting: Cardiology

## 2021-11-12 ENCOUNTER — Ambulatory Visit: Payer: PRIVATE HEALTH INSURANCE | Attending: Cardiology | Admitting: Cardiology

## 2021-11-12 VITALS — BP 116/60 | HR 77 | Ht 65.0 in | Wt 192.4 lb

## 2021-11-12 DIAGNOSIS — R0781 Pleurodynia: Secondary | ICD-10-CM
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
## 2021-11-12 NOTE — Patient Instructions (Addendum)
Medication Instructions:  Your physician recommends that you continue on your current medications as directed. Please refer to the Current Medication list given to you today.  *If you need a refill on your cardiac medications before your next appointment, please call your pharmacy*   Lab Work: NONE If you have labs (blood work) drawn today and your tests are completely normal, you will receive your results only by: [place] (if you have MyChart) OR A paper copy in the mail If you have any lab test that is abnormal or we need to change your treatment, we will call you to review the results.   Testing/Procedures: Your physician recommends that you have an CTA of your Chest/Aorta. The test will be done at [address] [facility] )   Follow-Up: At [facility], you and your health needs are our priority.  As part of our continuing mission to provide you with exceptional heart care, we have created designated Provider Care Teams.  These Care Teams include your primary Cardiologist (physician) and Advanced Practice Providers (APPs -  Physician Assistants and Nurse Practitioners) who all work together to provide you with the care you need, when you need it.  We recommend signing up for the patient portal called "MyChart".  Sign up information is provided on this After Visit Summary.  MyChart is used to connect with patients for Virtual Visits (Telemedicine).  Patients are able to view lab/test results, encounter notes, upcoming appointments, etc.  Non-urgent messages can be sent to your provider as well.   To learn more about what you can do with MyChart, go to [email or web address].    Your next appointment:   3 month(s)  The format for your next appointment:   In Person  Provider:   Berniece Salines, DO

## 2021-11-12 NOTE — Telephone Encounter (Signed)
Sent to MyChart: "Please also have the patient talk to her PCP or whoever prescribed the Lamictal because it seems to me that this is a medication interaction symptom driven problem."

## 2021-11-13 ENCOUNTER — Telehealth: Payer: Self-pay | Admitting: Cardiology

## 2021-11-13 ENCOUNTER — Ambulatory Visit (HOSPITAL_COMMUNITY): Payer: Managed Care, Other (non HMO)

## 2021-11-13 NOTE — Telephone Encounter (Signed)
Caller stated she was scheduled for testing yesterday but when she showed up today she is not on the schedule.

## 2021-11-13 NOTE — Telephone Encounter (Signed)
Spoke with scheduler and advised that scheduling was to get rescheduled due to insurance pending. They will call her to reschedule.   Thank you!

## 2021-11-14 ENCOUNTER — Ambulatory Visit (HOSPITAL_COMMUNITY)
Admission: RE | Admit: 2021-11-14 | Discharge: 2021-11-14 | Disposition: A | Discharge status: Home / Self Care | Payer: Managed Care, Other (non HMO) | Source: Ambulatory Visit | Attending: Cardiology | Admitting: Cardiology

## 2021-11-14 DIAGNOSIS — R0781 Pleurodynia: Secondary | ICD-10-CM | POA: Diagnosis present

## 2021-11-14 MED ORDER — IOHEXOL 350 MG/ML SOLN
75.0000 mL | Freq: Once | INTRAVENOUS | Status: AC | PRN
  Administered 2021-11-14: 75 mL via INTRAVENOUS

## 2021-12-03 ENCOUNTER — Other Ambulatory Visit (HOSPITAL_COMMUNITY): Payer: Self-pay | Admitting: Psychiatry

## 2021-12-03 ENCOUNTER — Telehealth (HOSPITAL_COMMUNITY): Payer: Medicaid Other | Admitting: Psychiatry

## 2021-12-03 DIAGNOSIS — F603 Borderline personality disorder: Secondary | ICD-10-CM

## 2021-12-06 DIAGNOSIS — Z419 Encounter for procedure for purposes other than remedying health state, unspecified: Secondary | ICD-10-CM | POA: Diagnosis not present

## 2021-12-09 ENCOUNTER — Telehealth (HOSPITAL_COMMUNITY): Payer: Medicaid Other | Admitting: Psychiatry

## 2021-12-11 ENCOUNTER — Other Ambulatory Visit (HOSPITAL_COMMUNITY): Payer: Self-pay | Admitting: Psychiatry

## 2021-12-11 DIAGNOSIS — F603 Borderline personality disorder: Secondary | ICD-10-CM

## 2022-01-04 ENCOUNTER — Ambulatory Visit (HOSPITAL_COMMUNITY)
Admission: EM | Admit: 2022-01-04 | Discharge: 2022-01-05 | Disposition: A | Discharge status: Other Acute Inpatient Hospital | Payer: 59 | Attending: Behavioral Health | Admitting: Behavioral Health

## 2022-01-04 DIAGNOSIS — F319 Bipolar disorder, unspecified: Secondary | ICD-10-CM | POA: Diagnosis not present

## 2022-01-04 DIAGNOSIS — Z1152 Encounter for screening for COVID-19: Secondary | ICD-10-CM | POA: Insufficient documentation

## 2022-01-04 DIAGNOSIS — F4310 Post-traumatic stress disorder, unspecified: Secondary | ICD-10-CM | POA: Insufficient documentation

## 2022-01-04 DIAGNOSIS — Z87891 Personal history of nicotine dependence: Secondary | ICD-10-CM | POA: Diagnosis not present

## 2022-01-04 DIAGNOSIS — F1910 Other psychoactive substance abuse, uncomplicated: Secondary | ICD-10-CM | POA: Diagnosis not present

## 2022-01-04 DIAGNOSIS — Z9151 Personal history of suicidal behavior: Secondary | ICD-10-CM | POA: Insufficient documentation

## 2022-01-04 DIAGNOSIS — Z62810 Personal history of physical and sexual abuse in childhood: Secondary | ICD-10-CM | POA: Diagnosis not present

## 2022-01-04 DIAGNOSIS — F1190 Opioid use, unspecified, uncomplicated: Secondary | ICD-10-CM | POA: Insufficient documentation

## 2022-01-04 DIAGNOSIS — R45851 Suicidal ideations: Secondary | ICD-10-CM | POA: Diagnosis present

## 2022-01-04 DIAGNOSIS — Z79899 Other long term (current) drug therapy: Secondary | ICD-10-CM | POA: Insufficient documentation

## 2022-01-04 DIAGNOSIS — F1410 Cocaine abuse, uncomplicated: Secondary | ICD-10-CM | POA: Diagnosis not present

## 2022-01-04 DIAGNOSIS — I498 Other specified cardiac arrhythmias: Secondary | ICD-10-CM | POA: Insufficient documentation

## 2022-01-04 DIAGNOSIS — F332 Major depressive disorder, recurrent severe without psychotic features: Secondary | ICD-10-CM | POA: Diagnosis not present

## 2022-01-04 LAB — RESP PANEL BY RT-PCR (RSV, FLU A&B, COVID)  RVPGX2
Influenza A by PCR: NEGATIVE
Influenza B by PCR: NEGATIVE
Resp Syncytial Virus by PCR: NEGATIVE
SARS Coronavirus 2 by RT PCR: NEGATIVE

## 2022-01-04 LAB — COMPREHENSIVE METABOLIC PANEL
ALT: 28 U/L (ref 0–44)
AST: 23 U/L (ref 15–41)
Albumin: 4.1 g/dL (ref 3.5–5.0)
Alkaline Phosphatase: 98 U/L (ref 38–126)
Anion gap: 12 (ref 5–15)
BUN: 8 mg/dL (ref 6–20)
CO2: 25 mmol/L (ref 22–32)
Calcium: 9.4 mg/dL (ref 8.9–10.3)
Chloride: 102 mmol/L (ref 98–111)
Creatinine, Ser: 0.83 mg/dL (ref 0.44–1.00)
GFR, Estimated: >60 mL/min (ref >60)
Glucose, Bld: 91 mg/dL (ref 70–99)
Potassium: 3.3 mmol/L — ABNORMAL LOW (ref 3.5–5.1)
Sodium: 139 mmol/L (ref 135–145)
Total Bilirubin: 0.3 mg/dL (ref 0.3–1.2)
Total Protein: 7.8 g/dL (ref 6.5–8.1)

## 2022-01-04 LAB — URINALYSIS, ROUTINE W REFLEX MICROSCOPIC
Bacteria, UA: NONE SEEN
Bilirubin Urine: NEGATIVE
Glucose, UA: NEGATIVE mg/dL
Hgb urine dipstick: NEGATIVE
Ketones, ur: NEGATIVE mg/dL
Nitrite: NEGATIVE
Protein, ur: 100 mg/dL — AB
Specific Gravity, Urine: 1.02 (ref 1.005–1.030)
WBC, UA: >50 WBC/hpf — ABNORMAL HIGH (ref 0–5)
pH: 7 (ref 5.0–8.0)

## 2022-01-04 LAB — CBC WITH DIFFERENTIAL/PLATELET
Abs Immature Granulocytes: 0.02 10*3/uL (ref 0.00–0.07)
Basophils Absolute: 0 10*3/uL (ref 0.0–0.1)
Basophils Relative: 0 %
Eosinophils Absolute: 0 10*3/uL (ref 0.0–0.5)
Eosinophils Relative: 0 %
HCT: 39.6 % (ref 36.0–46.0)
Hemoglobin: 13.2 g/dL (ref 12.0–15.0)
Immature Granulocytes: 0 %
Lymphocytes Relative: 30 %
Lymphs Abs: 2.6 10*3/uL (ref 0.7–4.0)
MCH: 28.1 pg (ref 26.0–34.0)
MCHC: 33.3 g/dL (ref 30.0–36.0)
MCV: 84.3 fL (ref 80.0–100.0)
Monocytes Absolute: 0.6 10*3/uL (ref 0.1–1.0)
Monocytes Relative: 7 %
Neutro Abs: 5.6 10*3/uL (ref 1.7–7.7)
Neutrophils Relative %: 63 %
Platelets: 271 10*3/uL (ref 150–400)
RBC: 4.7 MIL/uL (ref 3.87–5.11)
RDW: 14.4 % (ref 11.5–15.5)
WBC: 8.9 10*3/uL (ref 4.0–10.5)
nRBC: 0 % (ref 0.0–0.2)

## 2022-01-04 LAB — LIPID PANEL
Cholesterol: 169 mg/dL (ref 0–200)
HDL: 40 mg/dL — ABNORMAL LOW (ref >40)
LDL Cholesterol: 111 mg/dL — ABNORMAL HIGH (ref 0–99)
Total CHOL/HDL Ratio: 4.2 RATIO
Triglycerides: 91 mg/dL (ref <150)
VLDL: 18 mg/dL (ref 0–40)

## 2022-01-04 LAB — POC SARS CORONAVIRUS 2 AG
SARSCOV2ONAVIRUS 2 AG: NEGATIVE
SARSCOV2ONAVIRUS 2 AG: NEGATIVE

## 2022-01-04 LAB — POCT URINE DRUG SCREEN - MANUAL ENTRY (I-SCREEN)
POC Amphetamine UR: NOT DETECTED
POC Buprenorphine (BUP): NOT DETECTED
POC Cocaine UR: POSITIVE — AB
POC Marijuana UR: NOT DETECTED
POC Methadone UR: NOT DETECTED
POC Methamphetamine UR: NOT DETECTED
POC Morphine: NOT DETECTED
POC Oxazepam (BZO): POSITIVE — AB
POC Oxycodone UR: NOT DETECTED
POC Secobarbital (BAR): NOT DETECTED

## 2022-01-04 LAB — HIV ANTIBODY (ROUTINE TESTING W REFLEX): HIV Screen 4th Generation wRfx: NONREACTIVE

## 2022-01-04 LAB — ETHANOL: Alcohol, Ethyl (B): <10 mg/dL (ref <10)

## 2022-01-04 LAB — HEMOGLOBIN A1C
Hgb A1c MFr Bld: 5.2 % (ref 4.8–5.6)
Mean Plasma Glucose: 102.54 mg/dL

## 2022-01-04 LAB — TSH: TSH: 1.054 u[IU]/mL (ref 0.350–4.500)

## 2022-01-04 MED ORDER — NAPROXEN 500 MG PO TABS
500.0000 mg | ORAL_TABLET | Freq: Two times a day (BID) | ORAL | Status: DC | PRN
  Administered 2022-01-04 (×2): 500 mg via ORAL
  Filled 2022-01-04 (×2): qty 1

## 2022-01-04 MED ORDER — MAGNESIUM HYDROXIDE 400 MG/5ML PO SUSP: 30.0000 mL | Freq: Every day | ORAL | Status: DC | PRN

## 2022-01-04 MED ORDER — METHOCARBAMOL 500 MG PO TABS
500.0000 mg | ORAL_TABLET | Freq: Three times a day (TID) | ORAL | Status: DC | PRN
  Administered 2022-01-04 (×2): 500 mg via ORAL
  Filled 2022-01-04 (×2): qty 1

## 2022-01-04 MED ORDER — CLONIDINE HCL 0.1 MG PO TABS
0.1000 mg | ORAL_TABLET | Freq: Four times a day (QID) | ORAL | Status: DC
  Administered 2022-01-04 (×2): 0.1 mg via ORAL
  Filled 2022-01-04 (×3): qty 1

## 2022-01-04 MED ORDER — PROPRANOLOL HCL 10 MG PO TABS: 10.0000 mg | ORAL_TABLET | Freq: Every day | ORAL | Status: DC

## 2022-01-04 MED ORDER — HYDROXYZINE HCL 25 MG PO TABS
25.0000 mg | ORAL_TABLET | Freq: Three times a day (TID) | ORAL | Status: DC | PRN
  Administered 2022-01-04 (×2): 25 mg via ORAL
  Filled 2022-01-04 (×2): qty 1

## 2022-01-04 MED ORDER — ALUM & MAG HYDROXIDE-SIMETH 200-200-20 MG/5ML PO SUSP: 30.0000 mL | ORAL | Status: DC | PRN

## 2022-01-04 MED ORDER — NITROFURANTOIN MONOHYD MACRO 100 MG PO CAPS
100.0000 mg | ORAL_CAPSULE | Freq: Two times a day (BID) | ORAL | Status: DC
  Administered 2022-01-04 – 2022-01-05 (×2): 100 mg via ORAL
  Filled 2022-01-04 (×2): qty 1

## 2022-01-04 MED ORDER — CLONIDINE HCL 0.1 MG PO TABS: 0.1000 mg | ORAL_TABLET | Freq: Every day | ORAL | Status: DC

## 2022-01-04 MED ORDER — FLUOXETINE HCL 20 MG PO CAPS
20.0000 mg | ORAL_CAPSULE | Freq: Every day | ORAL | Status: DC
  Administered 2022-01-04 – 2022-01-05 (×2): 20 mg via ORAL
  Filled 2022-01-04 (×2): qty 1

## 2022-01-04 MED ORDER — CLONIDINE HCL 0.1 MG PO TABS: 0.1000 mg | ORAL_TABLET | ORAL | Status: DC

## 2022-01-04 MED ORDER — LAMOTRIGINE 25 MG PO TABS
25.0000 mg | ORAL_TABLET | Freq: Every day | ORAL | Status: DC
  Administered 2022-01-04 – 2022-01-05 (×2): 25 mg via ORAL
  Filled 2022-01-04: qty 1

## 2022-01-04 MED ORDER — ACETAMINOPHEN 325 MG PO TABS: 650.0000 mg | ORAL_TABLET | Freq: Four times a day (QID) | ORAL | Status: DC | PRN

## 2022-01-04 MED ORDER — LOPERAMIDE HCL 2 MG PO CAPS: 2.0000 mg | ORAL_CAPSULE | ORAL | Status: DC | PRN

## 2022-01-04 MED ORDER — DICYCLOMINE HCL 20 MG PO TABS
20.0000 mg | ORAL_TABLET | Freq: Four times a day (QID) | ORAL | Status: DC | PRN
  Administered 2022-01-04: 20 mg via ORAL
  Filled 2022-01-04: qty 1

## 2022-01-04 MED ORDER — ONDANSETRON 4 MG PO TBDP
4.0000 mg | ORAL_TABLET | Freq: Four times a day (QID) | ORAL | Status: DC | PRN
  Administered 2022-01-04: 4 mg via ORAL
  Filled 2022-01-04: qty 1

## 2022-01-04 NOTE — ED Notes (Signed)
Pt voices c/o nausea- PRN med given.

## 2022-01-04 NOTE — ED Notes (Signed)
Pt on phone, arguing with family members, cursing and upset. PRN meds given per order.

## 2022-01-04 NOTE — Progress Notes (Signed)
Lab called to add the urine culture, they confirm they can add to specimen sent earlier this date.
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
## 2022-01-04 NOTE — ED Notes (Signed)
Pt sleeping at present, no distress noted. Monitoring for safety.  Easily arouseable to verbal stimuli.  Skin color good, respirations even & unlabored.

## 2022-01-04 NOTE — ED Notes (Signed)
Pt A&O x 4, sleeping at present, no distress noted.  Monitoring for safety. 

## 2022-01-04 NOTE — ED Notes (Signed)
Pt laying quietly in recliner bed. Resp even and unlabored. Will continue to monitor for safety
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
## 2022-01-04 NOTE — Progress Notes (Signed)
   01/04/22 1217  [facility] Triage Screening (Walk-ins at [facility] only)  How Did You Hear About [name]? Family/Friend  What Is the Reason for Your Visit/Call Today? Patient presents voluntarily, accompanied by her fiance, for assessment due to ongoing and worsening depression and SI.  She reports she has had 4 intentional suicide attempts by overdose this week.  Each time her fiance has had to get "the pills out of my mouth."  Patient has also had one unintentional heroin overdose this past week, after which she was evaluated at [facility] and discharged shortly after arriving.  Patient is tearful throughout assessment,  stating several times that she does not want to be here.  She is 8 mos post partum and when her mother wanted to see the baby today, patient refused.  Shortly after this, CPS arrived with concerns about patient's mental health and overdose attempts.  The baby was taken into custody and placed with the bio father.  She continues to endorse SI and is seeking admission to "get help."  She denies HI or AVH.  She admits to relapsing on heroin, crack and "some percocets" 1 month ago and has been using daily.  How Long Has This Been Causing You Problems? 1-6 months  Have You Recently Had Any Thoughts About Hurting Yourself? Yes  How long ago did you have thoughts about hurting yourself? current SI  Are You Planning to Commit Suicide/Harm Yourself At This time? Yes  Have you Recently Had Thoughts About Hurting Someone Karolee Ohs? No  Are You Planning To Harm Someone At This Time? No  Are you currently experiencing any auditory, visual or other hallucinations? No  Have You Used Any Alcohol or Drugs in the Past 24 Hours? Yes  How long ago did you use Drugs or Alcohol? yesterday  What Did You Use and How Much? unknown amt of heroin and crack cocaine  Do you have any current medical co-morbidities that require immediate attention? No  Clinician description of patient physical appearance/behavior: Patient is  tearful and distressed, overall cooperative AAOx5  What Do You Feel Would Help You the Most Today? Alcohol or Drug Use Treatment;Treatment for Depression or other mood problem  If access to [facility] was not available, would you have sought care in the Emergency Department? Yes  Determination of Need Urgent (48 hours)  Options For Referral Inpatient Hospitalization;BH Urgent Care

## 2022-01-04 NOTE — ED Notes (Signed)
Pt feeling lightheaded and dizzy. Vitals rechecked, pt remains awake, alert & responsive, no distress notred.  Resting at present, will recheck in 30 min.
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
## 2022-01-04 NOTE — ED Notes (Signed)
Pt  on phone arguing with family members. Will continue to monitor for safety

## 2022-01-05 ENCOUNTER — Encounter (HOSPITAL_COMMUNITY): Payer: Self-pay | Admitting: Behavioral Health

## 2022-01-05 ENCOUNTER — Inpatient Hospital Stay (HOSPITAL_COMMUNITY)
Admission: EM | Admit: 2022-01-05 | Discharge: 2022-01-11 | DRG: 885 | Disposition: A | Discharge status: Home / Self Care | Payer: 59 | Source: Intra-hospital | Attending: Psychiatry | Admitting: Psychiatry

## 2022-01-05 ENCOUNTER — Other Ambulatory Visit: Payer: Self-pay

## 2022-01-05 DIAGNOSIS — F1123 Opioid dependence with withdrawal: Secondary | ICD-10-CM | POA: Diagnosis present

## 2022-01-05 DIAGNOSIS — F17290 Nicotine dependence, other tobacco product, uncomplicated: Secondary | ICD-10-CM | POA: Diagnosis present

## 2022-01-05 DIAGNOSIS — G4700 Insomnia, unspecified: Secondary | ICD-10-CM | POA: Diagnosis present

## 2022-01-05 DIAGNOSIS — F429 Obsessive-compulsive disorder, unspecified: Secondary | ICD-10-CM | POA: Diagnosis present

## 2022-01-05 DIAGNOSIS — Z91148 Patient's other noncompliance with medication regimen for other reason: Secondary | ICD-10-CM

## 2022-01-05 DIAGNOSIS — B9620 Unspecified Escherichia coli [E. coli] as the cause of diseases classified elsewhere: Secondary | ICD-10-CM | POA: Diagnosis present

## 2022-01-05 DIAGNOSIS — F4310 Post-traumatic stress disorder, unspecified: Secondary | ICD-10-CM | POA: Diagnosis present

## 2022-01-05 DIAGNOSIS — F603 Borderline personality disorder: Secondary | ICD-10-CM | POA: Diagnosis present

## 2022-01-05 DIAGNOSIS — Z818 Family history of other mental and behavioral disorders: Secondary | ICD-10-CM | POA: Diagnosis not present

## 2022-01-05 DIAGNOSIS — F411 Generalized anxiety disorder: Secondary | ICD-10-CM | POA: Diagnosis present

## 2022-01-05 DIAGNOSIS — F1310 Sedative, hypnotic or anxiolytic abuse, uncomplicated: Secondary | ICD-10-CM | POA: Diagnosis present

## 2022-01-05 DIAGNOSIS — Z8249 Family history of ischemic heart disease and other diseases of the circulatory system: Secondary | ICD-10-CM | POA: Diagnosis not present

## 2022-01-05 DIAGNOSIS — R45851 Suicidal ideations: Secondary | ICD-10-CM | POA: Diagnosis present

## 2022-01-05 DIAGNOSIS — F1911 Other psychoactive substance abuse, in remission: Secondary | ICD-10-CM | POA: Diagnosis present

## 2022-01-05 DIAGNOSIS — Z9151 Personal history of suicidal behavior: Secondary | ICD-10-CM | POA: Diagnosis not present

## 2022-01-05 DIAGNOSIS — F1110 Opioid abuse, uncomplicated: Secondary | ICD-10-CM | POA: Insufficient documentation

## 2022-01-05 DIAGNOSIS — F1410 Cocaine abuse, uncomplicated: Secondary | ICD-10-CM | POA: Diagnosis present

## 2022-01-05 DIAGNOSIS — F17210 Nicotine dependence, cigarettes, uncomplicated: Secondary | ICD-10-CM | POA: Diagnosis present

## 2022-01-05 DIAGNOSIS — N390 Urinary tract infection, site not specified: Secondary | ICD-10-CM | POA: Diagnosis present

## 2022-01-05 DIAGNOSIS — K5900 Constipation, unspecified: Secondary | ICD-10-CM | POA: Diagnosis present

## 2022-01-05 DIAGNOSIS — Z803 Family history of malignant neoplasm of breast: Secondary | ICD-10-CM

## 2022-01-05 DIAGNOSIS — F332 Major depressive disorder, recurrent severe without psychotic features: Principal | ICD-10-CM | POA: Diagnosis present

## 2022-01-05 LAB — RPR: RPR Ser Ql: NONREACTIVE

## 2022-01-05 MED ORDER — HYDROXYZINE HCL 25 MG PO TABS
25.0000 mg | ORAL_TABLET | Freq: Three times a day (TID) | ORAL | Status: DC | PRN
  Administered 2022-01-06 – 2022-01-10 (×4): 25 mg via ORAL
  Filled 2022-01-05 (×4): qty 1

## 2022-01-05 MED ORDER — ONDANSETRON 4 MG PO TBDP
4.0000 mg | ORAL_TABLET | Freq: Four times a day (QID) | ORAL | Status: AC | PRN
  Administered 2022-01-06: 4 mg via ORAL
  Filled 2022-01-05: qty 1

## 2022-01-05 MED ORDER — METHOCARBAMOL 500 MG PO TABS: 500.0000 mg | ORAL_TABLET | Freq: Three times a day (TID) | ORAL | Status: AC | PRN

## 2022-01-05 MED ORDER — POTASSIUM CHLORIDE CRYS ER 20 MEQ PO TBCR
20.0000 meq | EXTENDED_RELEASE_TABLET | Freq: Once | ORAL | Status: AC
  Administered 2022-01-05: 20 meq via ORAL
  Filled 2022-01-05 (×2): qty 1

## 2022-01-05 MED ORDER — POTASSIUM CHLORIDE CRYS ER 20 MEQ PO TBCR
20.0000 meq | EXTENDED_RELEASE_TABLET | Freq: Two times a day (BID) | ORAL | Status: DC
  Administered 2022-01-05: 20 meq via ORAL
  Filled 2022-01-05: qty 1

## 2022-01-05 MED ORDER — NITROFURANTOIN MONOHYD MACRO 100 MG PO CAPS
100.0000 mg | ORAL_CAPSULE | Freq: Two times a day (BID) | ORAL | Status: AC
  Administered 2022-01-05 – 2022-01-10 (×10): 100 mg via ORAL
  Filled 2022-01-05 (×12): qty 1

## 2022-01-05 MED ORDER — ACETAMINOPHEN 325 MG PO TABS: 650.0000 mg | ORAL_TABLET | Freq: Four times a day (QID) | ORAL | Status: DC | PRN

## 2022-01-05 MED ORDER — LOPERAMIDE HCL 2 MG PO CAPS: 2.0000 mg | ORAL_CAPSULE | ORAL | Status: AC | PRN

## 2022-01-05 MED ORDER — LAMOTRIGINE 25 MG PO TABS
25.0000 mg | ORAL_TABLET | Freq: Every day | ORAL | Status: DC
  Administered 2022-01-06 – 2022-01-11 (×6): 25 mg via ORAL
  Filled 2022-01-05 (×11): qty 1

## 2022-01-05 MED ORDER — MAGNESIUM HYDROXIDE 400 MG/5ML PO SUSP
30.0000 mL | Freq: Every day | ORAL | Status: DC | PRN
  Administered 2022-01-06 – 2022-01-07 (×2): 30 mL via ORAL
  Filled 2022-01-05 (×2): qty 30

## 2022-01-05 MED ORDER — DICYCLOMINE HCL 20 MG PO TABS: 20.0000 mg | ORAL_TABLET | Freq: Four times a day (QID) | ORAL | Status: AC | PRN

## 2022-01-05 MED ORDER — ALUM & MAG HYDROXIDE-SIMETH 200-200-20 MG/5ML PO SUSP: 30.0000 mL | ORAL | Status: DC | PRN

## 2022-01-05 MED ORDER — NAPROXEN 500 MG PO TABS: 500.0000 mg | ORAL_TABLET | Freq: Two times a day (BID) | ORAL | Status: AC | PRN

## 2022-01-05 MED ORDER — FLUOXETINE HCL 20 MG PO CAPS
20.0000 mg | ORAL_CAPSULE | Freq: Every day | ORAL | Status: DC
  Administered 2022-01-06: 20 mg via ORAL
  Filled 2022-01-05 (×4): qty 1
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
## 2022-01-05 NOTE — ED Notes (Signed)
Patient  sleeping in no acute stress. RR even and unlabored .Environment secured .Will continue to monitor for safely. 

## 2022-01-05 NOTE — ED Provider Notes (Signed)
FBC/OBS ASAP Discharge Summary  Date and Time: 01/05/2022 4:23 PM  Name: Jenny Clark  MRN:  [id number]   Discharge Diagnoses:  Final diagnoses:  Severe episode of recurrent major depressive disorder, without psychotic features (HCC)  Polysubstance abuse (HCC)  Opiate use  Cocaine abuse (HCC)  Suicidal ideation    Subjective: Patient seen and evaluated face-to-face by this provider, chart reviewed and case discussed with Dr. Lucianne MussKumar. On evaluation, patient is alert and oriented x 4. Her thought process is linear and speech is clear and coherent. Her mood is dysphoric and affect is congruent. She is calm and cooperative. She continues to endorse suicidal ideations. No suicide plan or intent at this time. She denies homicidal ideations. She denies auditory or visual hallucinations. There is no objective evidence that the patient is currently responding to internal or external stimuli. She continues to endorse depressive symptoms of guilt and hopelessness. She reports an improved appetite today and improved sleep last night. She denies opioid withdrawal symptoms. She was encouraged to increase fluid intake due to hypotension this am. She was asymptomatic on my exam but reported c/o dizziness and blurred vision that resolved with improved blood pressure and fluids. Clonidine and propranolol discontinued.   Stay Summary: Jenny PageHarley P Ostlund is a 21 year old female patient with a past psychiatric history significant for bipolar depression, MDD, PTSD, opiate use, and borderline personality [facility] accompanied by her fianc Allayne ButcherMarcus Thomas with a chief complaint of worsening depression, suicidal ideations and 4 recent suicide attempts. Patient was recommended for inpatient psych treatment. Patient [facility].    Total Time spent with patient: 30 minutes  Past Psychiatric History: history significant for bipolar depression, MDD,  PTSD, opiate use, and borderline personality Past Medical History:  Past Medical History:  Diagnosis Date   Anxiety    Bipolar disorder (HCC)    not fully diagnosed   Bronchitis    Depression    Drug abuse (HCC)    meth, cocaine,molly, marijuana   Headache    migraines   Missed abortion 06/17/2017   Plica syndrome of left knee 01/2014   Pneumonia    as a child   Tachycardia     Past Surgical History:  Procedure Laterality Date   DILATION AND EVACUATION N/A 06/19/2017   Procedure: DILATATION AND EVACUATION;  Surgeon: Sherian ReinBovard-Stuckert, Jody, MD;  Location: [facility];  Service: Gynecology;  Laterality: N/A;   KNEE ARTHROSCOPY Left 01/23/2014   Procedure: LEFT KNEE SCOPE WITH PLICA EXCISION;  Surgeon: Thera FlakeW D Caffrey Jr., MD;  Location: [facility];  Service: Orthopedics;  Laterality: Left;   WISDOM TOOTH EXTRACTION  01/10/2014   Family History:  Family History  Problem Relation Age of Onset   Other Maternal Grandmother        cerebral amyloiod angiopathy   Breast cancer Maternal Grandmother    Hypertension Maternal Grandfather     Social History:  Social History   Substance and Sexual Activity  Alcohol Use Not Currently   Comment: occas     Social History   Substance and Sexual Activity  Drug Use Not Currently   Types: Benzodiazepines, Cocaine, Amphetamines, Marijuana   Comment: last used 2017    Social History   Socioeconomic History   Marital status: Single    Spouse name: Not on file   Number of children: 0   Years of education: 14   Highest education level: Not on file  Occupational History   Occupation: [employer]  Tobacco Use   Smoking status: Former    Packs/day: 0.50    Years: 4.00    Total pack years: 2.00    Types: Cigarettes   Smokeless tobacco: Former  Building services engineer Use: Never used  Substance and Sexual Activity   Alcohol use: Not Currently    Comment: occas   Drug use: Not Currently    Types: Benzodiazepines,  Cocaine, Amphetamines, Marijuana    Comment: last used 2017   Sexual activity: Not Currently    Comment: offered condoms  Other Topics Concern   Not on file  Social History Narrative   Lives with aunt   Caffeine use: daily   Right handed    Social Determinants of Health   Financial Resource Strain: Not on file  Food Insecurity: Not on file  Transportation Needs: Not on file  Physical Activity: Not on file  Stress: Not on file  Social Connections: Not on file   SDOH:  SDOH Screenings   Alcohol Screen: Medium Risk (04/11/2018)  Depression (PHQ2-9): High Risk (01/04/2022)  Tobacco Use: Medium Risk (11/12/2021)    Tobacco Cessation:  N/A, patient does not currently use tobacco products  Current Medications:  Current Facility-Administered Medications  Medication Dose Route Frequency Provider Last Rate Last Admin   acetaminophen (TYLENOL) tablet 650 mg  650 mg Oral Q6H PRN Gerrod Maule L, NP       alum & mag hydroxide-simeth (MAALOX/MYLANTA) 200-200-20 MG/5ML suspension 30 mL  30 mL Oral Q4H PRN Sharnay Cashion L, NP       dicyclomine (BENTYL) tablet 20 mg  20 mg Oral Q6H PRN Arthea Nobel L, NP   20 mg at 01/04/22 2135   FLUoxetine (PROZAC) capsule 20 mg  20 mg Oral Daily Shanikia Kernodle L, NP   20 mg at 01/05/22 1941   hydrOXYzine (ATARAX) tablet 25 mg  25 mg Oral TID PRN Liborio Nixon L, NP   25 mg at 01/04/22 2135   lamoTRIgine (LAMICTAL) tablet 25 mg  25 mg Oral Daily Nathian Stencil L, NP   25 mg at 01/05/22 7408   loperamide (IMODIUM) capsule 2-4 mg  2-4 mg Oral PRN Kendy Haston L, NP       magnesium hydroxide (MILK OF MAGNESIA) suspension 30 mL  30 mL Oral Daily PRN Myleigh Amara L, NP       methocarbamol (ROBAXIN) tablet 500 mg  500 mg Oral Q8H PRN Melane Windholz L, NP   500 mg at 01/04/22 2135   naproxen (NAPROSYN) tablet 500 mg  500 mg Oral BID PRN Jovonna Nickell L, NP   500 mg at 01/04/22 2135   nitrofurantoin (macrocrystal-monohydrate) (MACROBID) capsule 100 mg  100  mg Oral Q12H Omare Bilotta L, NP   100 mg at 01/05/22 0953   ondansetron (ZOFRAN-ODT) disintegrating tablet 4 mg  4 mg Oral Q6H PRN Tremont Gavitt L, NP   4 mg at 01/04/22 1827   potassium chloride SA (KLOR-CON M) CR tablet 20 mEq  20 mEq Oral BID Albie Bazin L, NP   20 mEq at 01/05/22 1448   Current Outpatient Medications  Medication Sig Dispense Refill   FLUoxetine (PROZAC) 20 MG capsule Take 1 capsule (20 mg total) by mouth daily. 30 capsule 1   lamoTRIgine (LAMICTAL) 25 MG tablet Take 2 tablets (50 mg total) by mouth daily. 60 tablet 2   Multiple Vitamins-Minerals (MULTIVITAMIN WITH MINERALS) tablet Take 1 tablet by  mouth daily.     propranolol (INDERAL) 20 MG tablet Take 1 tablet (20 mg total) by mouth 2 (two) times daily. 90 tablet 1   tretinoin (RETIN-A) 0.025 % cream Apply 1 Application topically at bedtime.      PTA Medications: (Not in a hospital admission)      01/04/2022   12:56 PM 09/12/2021    1:54 PM 04/30/2018   11:46 AM  Depression screen PHQ 2/9  Decreased Interest 3 2 1   Down, Depressed, Hopeless 3 3 1   PHQ - 2 Score 6 5 2   Altered sleeping 3 3 2   Tired, decreased energy 2 3 2   Change in appetite 3 2 0  Feeling bad or failure about yourself  3 3 0  Trouble concentrating 3 3 1   Moving slowly or fidgety/restless 1 0 0  Suicidal thoughts 2 1 0  PHQ-9 Score 23 20 7   Difficult doing work/chores Very difficult  Somewhat difficult    Flowsheet Row ED from 01/04/2022 in [facility] from 11/08/2021 in [facility] Video Visit from [facility]/12/2021 [facility]  C-SSRS RISK CATEGORY No Risk No Risk Error: Q3, 4, or 5 should not be populated when Q2 is No       Musculoskeletal  Strength & Muscle Tone: within normal limits Gait & Station: normal Patient leans: N/A  Psychiatric Specialty Exam  Presentation  General Appearance:  Appropriate for Environment  Eye  Contact: Fair  Speech: Clear and Coherent  Speech Volume: Normal  Handedness:No data recorded  Mood and Affect  Mood: Dysphoric  Affect: Congruent   Thought Process  Thought Processes: Coherent  Descriptions of Associations:Intact  Orientation:Full (Time, Place and Person)  Thought Content:Logical  Diagnosis of Schizophrenia or Schizoaffective disorder in past: No    Hallucinations:Hallucinations: None  Ideas of Reference:None  Suicidal Thoughts:Suicidal Thoughts: Yes, Passive SI Active Intent and/or Plan: With Plan; With Means to Carry Out; With Intent  Homicidal Thoughts:Homicidal Thoughts: No   Sensorium  Memory: Immediate Fair; Recent Fair; Remote Fair  Judgment: Intact  Insight: Present   Executive Functions  Concentration: Fair  Attention Span: Fair  Recall: of Knowledge: Fair  Language: Fair   Psychomotor Activity  Psychomotor Activity: Psychomotor Activity: Normal   Assets  Assets: Communication Skills; Desire for Improvement; Leisure Time; Physical Health; Social Support   Sleep  Sleep: Sleep: Fair   Nutritional Assessment (For OBS and FBC admissions only) Has the patient had a weight loss or gain of 10 pounds or more in the last 3 months?: No Has the patient had a decrease in food intake/or appetite?: Yes Does the patient have dental problems?: No Does the patient have eating habits or behaviors that may be indicators of an eating disorder including binging or inducing vomiting?: No Has the patient recently lost weight without trying?: 0 Has the patient been eating poorly because of a decreased appetite?: 1 Malnutrition Screening Tool Score: 1    Physical Exam  Physical Exam HENT:     Head: Normocephalic.     Nose: Nose normal.  Musculoskeletal:        General: Normal range of motion.  Neurological:     Mental Status: She is alert and oriented to person, place, and time.    Review of Systems   Constitutional: Negative.   HENT: Negative.    Eyes: Negative.   Respiratory: Negative.    Cardiovascular: Negative.   Gastrointestinal: Negative.  Genitourinary: Negative.   Musculoskeletal: Negative.   Neurological: Negative.   Endo/Heme/Allergies: Negative.    Blood pressure (!) 98/58, pulse 66, temperature 98 F (36.7 C), resp. rate 18, height 5\' 5"  (1.651 m), weight 191 lb (86.6 kg), SpO2 98 %, unknown if currently breastfeeding. Body mass index is 31.78 kg/m.  Disposition: Patient recommended for inpatient psychiatric treatment. Patient iaccepted to the [facility] today, accepting physical is [facility]. Patient is voluntary. EMTALA completed. Admission orders placed for [facility].   Medications continued at time of discharge Prozac 20 mg p.o. daily Lamictal 25 mg p.o. daily, consider titrating up to home dose of 50 mg p.o. daily Propranolol 20 mg p.o. twice daily,d/c'd due to hypotension (HOME MED) Clonidine taper and d/c'd due to episodes of hypotension  Macrobid 100 mg p.o. twice daily x 5 days for UTI, urine culture pending Potassium 20 mEq PO x 1 doses for potassium of 3.3 (total of 2 doses)  Yan Okray L, NP 01/05/2022, 4:23 PM

## 2022-01-05 NOTE — ED Notes (Signed)
Patient alert and oriented x 3. Denies SI/HI/AVH. Denies intent or plan to harm self or others. Routine conducted according to faculty protocol. Encourage patient to notify staff with any needs or concerns. Patient verbalized agreement and understanding. Will continue to monitor for safety. 

## 2022-01-05 NOTE — [facility] Group Notes (Signed)
Pt did not attend wrap-up group   

## 2022-01-05 NOTE — Progress Notes (Signed)
Pt was accepted to [facility] TODAY 01/05/22; Bed Assignment 401-2  Pt meets inpatient criteria per Liborio Nixon, NP  Attending Physician will be Dr. Sherron Flemings  Report can be called to: Adult unit: [phone]  Pt can arrive after: BED IS  READY  Care Team notified:  Jacqualine Code, Durwin Reges, RN, Edythe Clarity, RN, Sharyne Peach, RN, and [address] LCSWA  [place], [place] 01/05/2022 @ 4:47 PM

## 2022-01-05 NOTE — ED Notes (Signed)
Pt sleeping at present, no distress noted.  Monitoring for safety. 

## 2022-01-05 NOTE — Tx Team (Signed)
Initial Treatment Plan 01/05/2022 5:18 PM ROBBI SPELLS [id number]    PATIENT STRESSORS: Financial difficulties   Health problems   Loss of custody of her 23 month old daughter   Marital or family conflict   Substance abuse     PATIENT STRENGTHS: Ability for insight  Capable of independent living  Manufacturing systems engineer  Motivation for treatment/growth    PATIENT IDENTIFIED PROBLEMS:                      DISCHARGE CRITERIA:  Adequate post-discharge living arrangements Improved stabilization in mood, thinking, and/or behavior Safe-care adequate arrangements made Withdrawal symptoms are absent or subacute and managed without 24-hour nursing intervention  PRELIMINARY DISCHARGE PLAN: Outpatient therapy Return to previous living arrangement Return to previous work or school arrangements  PATIENT/FAMILY INVOLVEMENT: This treatment plan has been presented to and reviewed with the patient, LABRIA WOS. The patient and family have been given the opportunity to ask questions and make suggestions.  Cherre Blanc, RN 01/05/2022, 5:18 PM

## 2022-01-05 NOTE — ED Notes (Signed)
Patient A&O x 4, ambulatory. Patient discharged in no acute distress. Patient denied SI/HI, A/VH upon discharge. Pt belongings returned to patient from locker #15  intact. Patient escorted to  back sally port by Rn and place in safe transport car to go to [facility] . Marland Kitchen Safety maintained.

## 2022-01-05 NOTE — ED Notes (Signed)
Rn called report but was told to call back due to admitting nurse was unavailable.

## 2022-01-05 NOTE — ED Notes (Signed)
Rn gave report to [facility]  RN@ 426 pm

## 2022-01-05 NOTE — Discharge Instructions (Addendum)
Transfer to [facility] 

## 2022-01-05 NOTE — ED Notes (Signed)
Patient states that she has some dizziness and blurred vision. States that it may be coming from her being tired. Notified provider. Will continue to monitor safety.

## 2022-01-05 NOTE — ED Notes (Signed)
Notified provider that patient blood pressure was 95/50 pulse 60. Provider states to hold Inderal and clonidine and to  increase fluids. Patient reports some dizziness . Informed provider . Wil  continue to monitor for safety

## 2022-01-05 NOTE — ED Notes (Signed)
Notified provider that patients blood pressure is 98/58 pulse 66 rr 18 oxygen 98 room air.Patient states that she is no longer dizzy or having blurred vision. . Patient has drank 2 Gatorades and is currently drinking  water. Will continue to monitor for safety.

## 2022-01-05 NOTE — ED Notes (Signed)
Patient A&O x 4, ambulatory. Patient discharged in no acute distress. Patient denied SI/HI, A/VH upon discharge. Patient verbalized understanding of all discharge instructions explained by staff, to include follow up appointments, RX's and safety plan. Patient reported mood 10/10.  Pt belongings returned to patient from locker intact. Patient escorted to lobby via staff for transport to destination. Safety maintained.  

## 2022-01-05 NOTE — Plan of Care (Signed)
Admission note to follow.    Problem: Education: Goal: Knowledge of [place] [facility] Education information/materials will improve Outcome: Progressing Goal: Emotional status will improve Outcome: Progressing Goal: Mental status will improve Outcome: Progressing Goal: Verbalization of understanding the information provided will improve Outcome: Progressing   Problem: Activity: Goal: Interest or engagement in activities will improve Outcome: Progressing Goal: Sleeping patterns will improve Outcome: Progressing   Problem: Coping: Goal: Ability to verbalize frustrations and anger appropriately will improve Outcome: Progressing Goal: Ability to demonstrate self-control will improve Outcome: Progressing   Problem: Health Behavior/Discharge Planning: Goal: Identification of resources available to assist in meeting health care needs will improve Outcome: Progressing Goal: Compliance with treatment plan for underlying cause of condition will improve Outcome: Progressing   Problem: Physical Regulation: Goal: Ability to maintain clinical measurements within normal limits will improve Outcome: Progressing   Problem: Safety: Goal: Periods of time without injury will increase Outcome: Progressing   Problem: Education: Goal: Ability to make informed decisions regarding treatment will improve Outcome: Progressing   Problem: Coping: Goal: Coping ability will improve Outcome: Progressing   Problem: Health Behavior/Discharge Planning: Goal: Identification of resources available to assist in meeting health care needs will improve Outcome: Progressing   Problem: Medication: Goal: Compliance with prescribed medication regimen will improve Outcome: Progressing   Problem: Self-Concept: Goal: Ability to disclose and discuss suicidal ideas will improve Outcome: Progressing Goal: Will verbalize positive feelings about self Outcome: Progressing

## 2022-01-06 ENCOUNTER — Encounter (HOSPITAL_COMMUNITY): Payer: Self-pay

## 2022-01-06 DIAGNOSIS — F1410 Cocaine abuse, uncomplicated: Secondary | ICD-10-CM | POA: Insufficient documentation

## 2022-01-06 DIAGNOSIS — F1110 Opioid abuse, uncomplicated: Secondary | ICD-10-CM | POA: Insufficient documentation

## 2022-01-06 DIAGNOSIS — F1310 Sedative, hypnotic or anxiolytic abuse, uncomplicated: Secondary | ICD-10-CM | POA: Insufficient documentation

## 2022-01-06 LAB — URINE CULTURE
Culture: >=100000 — AB
Special Requests: NORMAL

## 2022-01-06 MED ORDER — CHLORDIAZEPOXIDE HCL 25 MG PO CAPS: 25.0000 mg | ORAL_CAPSULE | Freq: Four times a day (QID) | ORAL | Status: AC | PRN

## 2022-01-06 MED ORDER — FLUOXETINE HCL 20 MG PO CAPS
40.0000 mg | ORAL_CAPSULE | Freq: Every day | ORAL | Status: DC
  Administered 2022-01-07 – 2022-01-11 (×5): 40 mg via ORAL
  Filled 2022-01-06 (×6): qty 2

## 2022-01-06 MED ORDER — FLUCONAZOLE 100 MG PO TABS
100.0000 mg | ORAL_TABLET | Freq: Every day | ORAL | Status: DC
  Administered 2022-01-10 – 2022-01-11 (×2): 100 mg via ORAL
  Filled 2022-01-06 (×4): qty 1

## 2022-01-06 NOTE — Progress Notes (Signed)
D: Pt reports passive SI this morning, no lethal plan expressed at this time. Pt denied HI/AVH. Pt rated her depression a 9/10, anxiety a 9/10, and feelings of hopelessness a 9/10. Pt complains of nausea and constipation during this shift. Pt has been pleasant, calm, and cooperative throughout the shift.   A: RN provided support and encouragement to patient. Pt given scheduled medications as prescribed. PRN Milk of Magnesium given for constipation. PRN Zofran given for nausea. Q15 min checks verified for safety.    R: Patient verbally contracts for safety. Patient compliant with medications and treatment plan. Patient is interacting well on the unit. Pt is safe on the unit.   01/06/22 1215  Psych Admission Type (Psych Patients Only)  Admission Status Voluntary  Psychosocial Assessment  Patient Complaints Anxiety;Depression  Eye Contact Fair  Facial Expression Anxious;Sad  Affect Anxious;Sad  Speech Logical/coherent  Interaction Assertive  Motor Activity Other (Comment) (WDL)  Appearance/Hygiene Disheveled  Behavior Characteristics Appropriate to situation  Mood Depressed;Anxious;Sad  Thought Process  Coherency WDL  Content WDL  Delusions None reported or observed  Perception WDL  Hallucination None reported or observed  Judgment Impaired  Confusion None  Danger to Self  Current suicidal ideation? Passive  Self-Injurious Behavior No self-injurious ideation or behavior indicators observed or expressed   Agreement Not to Harm Self Yes  Description of Agreement Verbally Contracts  Danger to Others  Danger to Others None reported or observed

## 2022-01-06 NOTE — H&P (Addendum)
Psychiatric Adult Admission Assessment  Patient Identification: Jenny Clark MRN:  [id number] Date of Evaluation:  01/06/2022 Chief Complaint:  MDD (major depressive disorder), recurrent severe, without psychosis (HCC) [F33.2] Principal Diagnosis: MDD (major depressive disorder), recurrent severe, without psychosis (HCC) Diagnosis:  Principal Problem:   MDD (major depressive disorder), recurrent severe, without psychosis (HCC)   Reason for Admission: Jenny Clark is a 22 year old female patient with a past psychiatric history significant for MDD, PTSD, OCD, remote history of methamphetamine use, opiate abuse, and borderline personality who presents to [facility] from [facility] behavioral health urgent care voluntary for worsening depression, suicidal ideations and 4 recent suicide attempts in past week in context of relapse on crack cocaine, xanax, muscle relaxants, percocet, and heroin.  HPI: Patient reports having worsening depression symptoms approximately 4-5 weeks ago.  He states that she has been medication compliant and doing well but then multiple stressors including verbal altercations with mom, plans to move out of mom's house falling through, worsening depressive symptoms led to her relapsing on heroin, crack cocaine, Percocet, Xanax, muscle relaxants. She stated it was to make her feel "numb". She states she had been free from regular substance use for ~6 years with the exception of a relapse in 2020.  Patient had worsening depressive symptoms including poor sleep, poor appetite, low energy, poor concentration, anhedonia, depressed mood.  Patient endorses increased guilt due to feeling that she was the reason her fianc also relapsed.  She reports that she had accidentally overdosed on heroin requiring Narcan multiple times as well as CPR by fiance.  She reports that approximately 1 to 2 weeks ago, her mom called CPS on patient and emergency custody was filed for patient's 45-month-old  daughter to patient's mother.  Mom also kicked patient out of her home as well as estranged patient from all other family members and friends that she had by telling them patient was a "junkie and abusing her daughter". She adamantly denies improper care of daughter and denies ever abusing daughter. Patient endorses having at least 4 aborted suicide by Lamictal overdoses in the past week because of the more recent stressors.  She states that the Lamictal was simply closest bottle she had rather than she was actively attempting to commit suicide via lamotrigine.  She currently is still feeling suicidal.  She is able to contract for safety while on the unit.  She denies HI/AVH.  She denies ever experiencing psychotic symptoms.  She reports having hypomania outside of substance use though including pressured speech, increased impulsivity, euphoria, racing thoughts, decreased need for sleep.  She states that these episodes only last 1 to 2 days.  She does admit to having been medication noncompliant for approximate 1 to 2 weeks prior to hospitalization.   Patient states she is currently experiencing opiate withdrawal symptoms including profuse vomiting as well as mild dizziness.  She denies ever experiencing seizures.   She endorses history of anxiety with associated panic attacks.  She states that she feels "agitated, overstimulated, and started hyperventilating".  She reports having history of PTSD symptoms including flashbacks, nightmares, anxiety, avoidance.   We discussed whether patient was interested in residential rehab versus SA IOP.  She is willing to consider residential but feels she needs to first deal with her withdrawal.  Collateral: unable to obtain as fiance is currently under IVC and being transferred to an inpatient psychiatric facility  Past Psychiatric Hx: Previous Psych Diagnoses: MDD, PTSD, GAD, Borderline personality disorder, OCD Prior inpatient treatment: 2020  admission, 2017  admission, 2016 admission.  Current/prior outpatient treatment: lamictal 50, prozac 20, propranolol 20 mg bid Psychotherapy hx: not like talking.  History of suicide: multiple since age 60 History of homicide: denies Psychiatric medication history:tegretol, prozac, cymbalta, depakote Psychiatric medication compliance history: fair Neuromodulation history: denies Current Psychiatrist: Dr. Nelida Gores Current therapist: denies  Substance Abuse Hx: Alcohol: drinks 5-10 twisted tea socially (every 2-3 weeks) Tobacco: vapes nicotine Illicit drugs: formerly methamphetamine. Daily use of crack cocaine, heroin Rx drug abuse: xanax, percocet Rehab hx: denies  Past Medical History: Medical Diagnoses:  Past Medical History:  Diagnosis Date   Anxiety    Bipolar disorder ([place])    not fully diagnosed   Bronchitis    Depression    Drug abuse ([place])    meth, cocaine,molly, marijuana   Headache    migraines   Missed abortion 54/65/0354   Plica syndrome of left knee 01/2014   Pneumonia    as a child   Tachycardia    Current home medications:none besides above Prior Hosp: denies Prior Surgeries/Trauma (Head trauma, LOC, concussions, seizures):denies Allergies: No Known Allergies PCP: Pcp, No   Risk to self: moderate Risk to others: minimal  Lab Results:  Results for orders placed or performed during the hospital encounter of 01/04/22 (from the past 48 hour(s))  POC SARS Coronavirus 2 Ag     Status: None   Collection Time: 01/04/22  3:44 PM  Result Value Ref Range   SARSCOV2ONAVIRUS 2 AG NEGATIVE NEGATIVE    Comment: (NOTE) SARS-CoV-2 antigen NOT DETECTED.   Negative results are presumptive.  Negative results do not preclude SARS-CoV-2 infection and should not be used as the sole basis for treatment or other patient management decisions, including infection  control decisions, particularly in the presence of clinical signs and  symptoms consistent with COVID-19, or in those who  have been in contact with the virus.  Negative results must be combined with clinical observations, patient history, and epidemiological information. The expected result is Negative.  Fact Sheet for Patients: HandmadeRecipes.com.cy  Fact Sheet for Healthcare Providers: FuneralLife.at  This test is not yet approved or cleared by the [place] FDA and  has been authorized for detection and/or diagnosis of SARS-CoV-2 by FDA under an Emergency Use Authorization (EUA).  This EUA will remain in effect (meaning this test can be used) for the duration of  the COV ID-19 declaration under Section 564(b)(1) of the Act, 21 U.S.C. section 360bbb-3(b)(1), unless the authorization is terminated or revoked sooner.    POC SARS Coronavirus 2 Ag     Status: None   Collection Time: 01/04/22  3:45 PM  Result Value Ref Range   SARSCOV2ONAVIRUS 2 AG NEGATIVE NEGATIVE    Comment: (NOTE) SARS-CoV-2 antigen NOT DETECTED.   Negative results are presumptive.  Negative results do not preclude SARS-CoV-2 infection and should not be used as the sole basis for treatment or other patient management decisions, including infection  control decisions, particularly in the presence of clinical signs and  symptoms consistent with COVID-19, or in those who have been in contact with the virus.  Negative results must be combined with clinical observations, patient history, and epidemiological information. The expected result is Negative.  Fact Sheet for Patients: HandmadeRecipes.com.cy  Fact Sheet for Healthcare Providers: FuneralLife.at  This test is not yet approved or cleared by the [place] FDA and  has been authorized for detection and/or diagnosis of SARS-CoV-2 by FDA under an Emergency Use Authorization (EUA).  This  EUA will remain in effect (meaning this test can be used) for the duration of  the COV ID-19  declaration under Section 564(b)(1) of the Act, 21 U.S.C. section 360bbb-3(b)(1), unless the authorization is terminated or revoked sooner.      Blood Alcohol level:  Lab Results  Component Value Date   ETH <10 01/04/2022   ETH 71 (H) 11/08/2021    Metabolic Disorder Labs:  Lab Results  Component Value Date   HGBA1C 5.2 01/04/2022   MPG 102.54 01/04/2022   MPG 93.93 04/12/2018   No results found for: "PROLACTIN" Lab Results  Component Value Date   CHOL 169 01/04/2022   TRIG 91 01/04/2022   HDL 40 (L) 01/04/2022   CHOLHDL 4.2 01/04/2022   VLDL 18 01/04/2022   LDLCALC 111 (H) 01/04/2022   LDLCALC 93 04/12/2018    Musculoskeletal: Strength & Muscle Tone: wnl Gait & Station: n/a  Psychiatric Specialty Exam: Presentation  General Appearance:  Appropriate for Environment  Eye Contact: Fair  Speech: Clear and Coherent  Speech Volume: Normal   Mood and Affect  Mood: Dysphoric  Affect: Congruent   Thought Process  Thought Processes: Coherent  Descriptions of Associations:Intact  Orientation:Full (Time, Place and Person)  Thought Content:Logical  Hallucinations:Hallucinations: None  Ideas of Reference:None  Suicidal Thoughts:Suicidal Thoughts: Yes, Passive  Homicidal Thoughts:Homicidal Thoughts: No   Sensorium  Memory: Immediate Fair; Recent Fair; Remote Fair  Judgment: Intact  Insight: Present   Executive Functions  Concentration: Fair  Attention Span: Fair  Recall: [employer] of Knowledge: Fair  Language: Fair   Psychomotor Activity  Psychomotor Activity: Psychomotor Activity: Normal   Assets  Assets: Communication Skills; Desire for Improvement; Leisure Time; Physical Health; Social Support   Sleep  Sleep: Sleep: Fair   Physical Exam: Physical Exam Review of Systems  Constitutional:  Positive for chills and malaise/fatigue.  Respiratory:  Negative for shortness of breath.   Cardiovascular:   Negative for chest pain.  Gastrointestinal:  Positive for constipation, nausea and vomiting. Negative for abdominal pain, diarrhea and heartburn.  Neurological:  Positive for dizziness. Negative for headaches.   Blood pressure 96/60, pulse 97, temperature 97.9 F (36.6 C), temperature source Oral, resp. rate 16, height 5\' 5"  (1.651 m), weight 87.1 kg, SpO2 100 %, unknown if currently breastfeeding. Body mass index is 31.95 kg/m.  Assessment Jenny Clark is a 22 year old female patient with a past psychiatric history significant for MDD, PTSD, OCD, remote history of methamphetamine use, opiate abuse, and borderline personality who presents to [facility] from [facility] behavioral health urgent care voluntary for worsening depression, suicidal ideations and 4 recent suicide attempts in past week in context of relapse on crack cocaine, xanax, muscle relaxants, percocet, and heroin.  PLAN Safety and Monitoring: voluntarily admission to inpatient psychiatric unit for safety, stabilization and treatment Daily contact with patient to assess and evaluate symptoms and progress in treatment Appropriate medication management to further stabilize patient Patient's case will be regularly discussed in multi-disciplinary team meeting Observation Level : q15 minute checks Vital signs: q12 hours Precautions: suicide, elopement, and assault  2. Psychiatric Problems Major depressive disorder-recurrent episode, severe without psychotic features Borderline personality disorder Obsessive-compulsive disorder PTSD Stimulant use disorder Sedative use disorder Opiate use disorder -Increase Prozac to 40 mg daily for depressive symptoms -Continue lamotrigine 25 mg daily for mood lability -CIWA protocol with librium PRN -COWS protocol (last score 4) -Holding propranolol given blood pressure soft today likely secondary to vomiting.  Plan to resume at  some point given she also taking it for her chest  palpitations -- The risks/benefits/side-effects/alternatives to medications were discussed in detail with the patient and time was given for questions. The patient consents to medication trial.  -- Encouraged patient to participate in unit milieu and in scheduled group therapies   3. Medical Management UTI Positive for E. coli on urine culture -Continue Macrobid for total of 5 days -Diflucan to start after 5 days of Macrobid when she has history of thrush after antibiotics  PRN The following PRN medications were added to ensure patient can focus on treatment. These were discussed with patient and patient aware of ability to ask for the following medications:  -Tylenol 650 mg q6hr PRN for mild pain -Mylanta 30 ml suspension for indigestion -Milk of Magnesia 30 ml for constipation -Trazodone 50 mg qhs for insomnia -Hydroxyzine 25 mg tid PRN for anxiety  4. Discharge Planning Patient will require the following based on my assessment:  Greatly appreciate CSW and Case management assistance with facilitating these needs and any further recommendations regarding patient's needs upon discharge. Estimated LOS: 5-7 days Discharge Concerns: Need to establish a safety plan; Medication compliance and effectiveness Discharge Goals: Return home with outpatient referrals for mental health follow-up including medication management/psychotherapy   Long Term Goal(s): Minimizing disruption current psychiatric diagnosis is causing so that patient can be safely discharged Short Term Goals: Compliance with proposed treatment plan and adjusting to psychiatric unit and peers.  I certify that inpatient services furnished can reasonably be expected to improve the patient's condition.     France Ravens, MD PGY2 Psychiatry Resident 01/06/2022 1:18 PM

## 2022-01-06 NOTE — [facility] Group Notes (Signed)
[facility] Group Notes:  (Nursing/MHT/Case Management/Adjunct)  Date:  01/06/2022  Time:  9:47 AM  Type of Therapy:  Group Therapy  Participation Level:  None  Participation Quality:  Resistant  Affect:  Resistant  Cognitive:  Lacking  Insight:  None  Engagement in Group:  None  Modes of Intervention:  Socialization  Summary of Progress/Problems:  Jenny Clark 01/06/2022, 9:47 AM

## 2022-01-06 NOTE — [facility] Group Notes (Signed)
Pt attended AA group 

## 2022-01-06 NOTE — BH IP Treatment Plan (Signed)
Interdisciplinary Treatment and Diagnostic Plan Update  01/06/2022 Time of Session: 9:50am Jenny Clark MRN: [id number]  Principal Diagnosis: MDD (major depressive disorder), recurrent severe, without psychosis ([place])  Secondary Diagnoses: Principal Problem:   MDD (major depressive disorder), recurrent severe, without psychosis ([place])   Current Medications:  Current Facility-Administered Medications  Medication Dose Route Frequency Provider Last Rate Last Admin   acetaminophen (TYLENOL) tablet 650 mg  650 mg Oral Q6H PRN White, Patrice L, NP       alum & mag hydroxide-simeth (MAALOX/MYLANTA) 200-200-20 MG/5ML suspension 30 mL  30 mL Oral Q4H PRN White, Patrice L, NP       dicyclomine (BENTYL) tablet 20 mg  20 mg Oral Q6H PRN White, Patrice L, NP       [START ON 01/07/2022] FLUoxetine (PROZAC) capsule 40 mg  40 mg Oral Daily France Ravens, MD       hydrOXYzine (ATARAX) tablet 25 mg  25 mg Oral TID PRN White, Patrice L, NP       lamoTRIgine (LAMICTAL) tablet 25 mg  25 mg Oral Daily White, Patrice L, NP   25 mg at 01/06/22 0810   loperamide (IMODIUM) capsule 2-4 mg  2-4 mg Oral PRN White, Patrice L, NP       magnesium hydroxide (MILK OF MAGNESIA) suspension 30 mL  30 mL Oral Daily PRN White, Patrice L, NP   30 mL at 01/06/22 0810   methocarbamol (ROBAXIN) tablet 500 mg  500 mg Oral Q8H PRN White, Patrice L, NP       naproxen (NAPROSYN) tablet 500 mg  500 mg Oral BID PRN White, Patrice L, NP       nitrofurantoin (macrocrystal-monohydrate) (MACROBID) capsule 100 mg  100 mg Oral Q12H White, Patrice L, NP   100 mg at 01/06/22 0810   ondansetron (ZOFRAN-ODT) disintegrating tablet 4 mg  4 mg Oral Q6H PRN White, Patrice L, NP   4 mg at 01/06/22 2952   PTA Medications: Medications Prior to Admission  Medication Sig Dispense Refill Last Dose   FLUoxetine (PROZAC) 20 MG capsule Take 1 capsule (20 mg total) by mouth daily. 30 capsule 1    lamoTRIgine (LAMICTAL) 25 MG tablet Take 2 tablets (50 mg total)  by mouth daily. 60 tablet 2    Multiple Vitamins-Minerals (MULTIVITAMIN WITH MINERALS) tablet Take 1 tablet by mouth daily.      propranolol (INDERAL) 20 MG tablet Take 1 tablet (20 mg total) by mouth 2 (two) times daily. 90 tablet 1    tretinoin (RETIN-A) 0.025 % cream Apply 1 Application topically at bedtime.       Patient Stressors: Financial difficulties   Health problems   Loss of custody of her 19 month old daughter   Marital or family conflict   Substance abuse    Patient Strengths: Ability for insight  Capable of independent living  [employer] forces logistics/support/administrative officer  Motivation for treatment/growth   Treatment Modalities: Medication Management, Group therapy, Case management,  1 to 1 session with clinician, Psychoeducation, Recreational therapy.   Physician Treatment Plan for Primary Diagnosis: MDD (major depressive disorder), recurrent severe, without psychosis ([place]) Long Term Goal(s):     Short Term Goals:    Medication Management: Evaluate patient's response, side effects, and tolerance of medication regimen.  Therapeutic Interventions: 1 to 1 sessions, Unit Group sessions and Medication administration.  Evaluation of Outcomes: Progressing  Physician Treatment Plan for Secondary Diagnosis: Principal Problem:   MDD (major depressive disorder), recurrent severe, without psychosis ([place])  Long Term Goal(s):     Short Term Goals:       Medication Management: Evaluate patient's response, side effects, and tolerance of medication regimen.  Therapeutic Interventions: 1 to 1 sessions, Unit Group sessions and Medication administration.  Evaluation of Outcomes: Progressing   RN Treatment Plan for Primary Diagnosis: MDD (major depressive disorder), recurrent severe, without psychosis (HCC) Long Term Goal(s): Knowledge of disease and therapeutic regimen to maintain health will improve  Short Term Goals: Ability to remain free from injury will improve, Ability to verbalize frustration  and anger appropriately will improve, Ability to demonstrate self-control, Ability to participate in decision making will improve, Ability to verbalize feelings will improve, Ability to disclose and discuss suicidal ideas, Ability to identify and develop effective coping behaviors will improve, and Compliance with prescribed medications will improve  Medication Management: RN will administer medications as ordered by provider, will assess and evaluate patient's response and provide education to patient for prescribed medication. RN will report any adverse and/or side effects to prescribing provider.  Therapeutic Interventions: 1 on 1 counseling sessions, Psychoeducation, Medication administration, Evaluate responses to treatment, Monitor vital signs and CBGs as ordered, Perform/monitor CIWA, COWS, AIMS and Fall Risk screenings as ordered, Perform wound care treatments as ordered.  Evaluation of Outcomes: Progressing   LCSW Treatment Plan for Primary Diagnosis: MDD (major depressive disorder), recurrent severe, without psychosis (HCC) Long Term Goal(s): Safe transition to appropriate next level of care at discharge, Engage patient in therapeutic group addressing interpersonal concerns.  Short Term Goals: Engage patient in aftercare planning with referrals and resources, Increase social support, Increase ability to appropriately verbalize feelings, Increase emotional regulation, Facilitate acceptance of mental health diagnosis and concerns, Facilitate patient progression through stages of change regarding substance use diagnoses and concerns, Identify triggers associated with mental health/substance abuse issues, and Increase skills for wellness and recovery  Therapeutic Interventions: Assess for all discharge needs, 1 to 1 time with Social worker, Explore available resources and support systems, Assess for adequacy in community support network, Educate family and significant other(s) on suicide  prevention, Complete Psychosocial Assessment, Interpersonal group therapy.  Evaluation of Outcomes: Progressing   Progress in Treatment: Attending groups: No. Participating in groups: No. Taking medication as prescribed: Yes. Toleration medication: Yes. Family/Significant other contact made: No, will contact:  patient has declined consents Patient understands diagnosis: Yes. Discussing patient identified problems/goals with staff: Yes. Medical problems stabilized or resolved: Yes. Denies suicidal/homicidal ideation: No. Issues/concerns per patient self-inventory: No.   New problem(s) identified: No, Describe:  none reported   New Short Term/Long Term Goal(s):  medication stabilization, elimination of SI thoughts, development of comprehensive mental wellness plan.    Patient Goals: Pt states, " I want to detox off of drugs, get treatment and not want to die everyday"   Discharge Plan or Barriers: patient recently homeless.    Reason for Continuation of Hospitalization: Anxiety Depression Medication stabilization Suicidal ideation Withdrawal symptoms  Estimated Length of Stay: 3-5 days  Last 3 [place] Suicide Severity Risk Score: Flowsheet Row [facility] (Current[facility] from [facility]/2022 in [facility] ED from 11/08/2021 in [facility]  C-SSRS RISK CATEGORY High Risk No Risk No Risk       Last PHQ 2/9 Scores:    01/04/2022   12:56 PM 09/12/2021    1:54 PM 04/30/2018   11:46 AM  Depression screen PHQ 2/9  Decreased Interest 3 2 1   Down,  Depressed, Hopeless 3 3 1   PHQ - 2 Score 6 5 2   Altered sleeping 3 3 2   Tired, decreased energy 2 3 2   Change in appetite 3 2 0  Feeling bad or failure about yourself  3 3 0  Trouble concentrating 3 3 1   Moving slowly or fidgety/restless 1 0 0  Suicidal thoughts 2 1 0  PHQ-9 Score 23 20 7   Difficult doing  work/chores Very difficult  Somewhat difficult    Scribe for Treatment Team: Zachery Conch, LCSW 01/06/2022 1:07 PM
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
## 2022-01-06 NOTE — Progress Notes (Signed)
   01/06/22 0555  15 Minute Checks  Location Bedroom  Visual Appearance Calm  Behavior Sleeping  Sleep (Behavioral Health Patients Only)  Calculate sleep? (Click Yes once per 24 hr at 0600 safety check) Yes  Documented sleep last 24 hours 5
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
## 2022-01-06 NOTE — Progress Notes (Signed)
Patient reports depression 10/10, and anxiety 8/10. Patient denies SI,HI, and AVH, patient stated that today's group was "very helpful" and that she was irritated about not attending group since admission. Patient blamed her mother for her mental health issues, she stated that "my mother planned for my hospitalization and for people to take my daughter." She added, "I am here." Patient pleasant and cooperative with care. RN provided support and encouragement, PRN hydroxyzine administered for anxiety, scheduled  HS medication administered and tolerated well. Q 15 minutes safety maintained, patient verbally contracts for safety, pt kept safe on the unit.

## 2022-01-07 LAB — GC/CHLAMYDIA PROBE AMP (~~LOC~~) NOT AT ARMC
Chlamydia: NEGATIVE
Comment: NEGATIVE
Comment: NORMAL
Neisseria Gonorrhea: NEGATIVE

## 2022-01-07 MED ORDER — WHITE PETROLATUM EX OINT
TOPICAL_OINTMENT | CUTANEOUS | Status: AC
  Administered 2022-01-08: 1 via OTIC
  Filled 2022-01-07: qty 5

## 2022-01-07 NOTE — Group Note (Signed)
LCSW Group Therapy Note   Group Date: 01/07/2022 Start Time: 1100 End Time: 1200  Type of Therapy and Topic: Group Therapy: Effective Communication   Participation Level: Active    Description of Group:  In this group patients will be asked to identify their own styles of communication as well as defining and identifying passive, assertive, and aggressive styles of communication. Participants will identify strategies to communicate in a more assertive manner in an effort to appropriately meet their needs. This group will be process-oriented, with patients participating in exploration of their own experiences as well as giving and receiving support and challenge from other group members.   Therapeutic Goals: 1. Patient will identify their personal communication style. 2. Patient will identify passive, assertive, and aggressive forms of communication. 3. Patient will identify strategies for developing more effective communication to appropriately meet their needs.    Summary of Patient Progress: The Pt attended group and remained there the entire time.  The Pt accepted all materials and worksheets that were provided.  The Pt was able to identify what type of communication they use most often and work on ways to have better communication in the future.  The Pt was appropriate with their peers and staff members.     Therapeutic Modalities:  Communication Skills Solution Focused Therapy Motivational Interviewing  Darleen Crocker, [place] 01/07/2022  1:59 PM

## 2022-01-07 NOTE — Progress Notes (Signed)
Adult Psychoeducational Group Note  Date:  01/07/2022 Time:  9:22 PM  Group Topic/Focus:  Wrap-Up Group:   The focus of this group is to help patients review their daily goal of treatment and discuss progress on daily workbooks.  Participation Level:  Active  Participation Quality:  Appropriate  Affect:  Appropriate  Cognitive:  Appropriate  Insight: Appropriate  Engagement in Group:  Engaged  Modes of Intervention:  Discussion  Additional Comments:  Pt states that she was irritated earlier in the day. She was able to attend group and speak with her doctors. Pt endorsed feelings of depression and wants to learn how to decrease her negative talk  Gerhard Perches 01/07/2022, 9:22 PM

## 2022-01-07 NOTE — BHH Group Notes (Signed)
Pt attended group and participated in discussion. 

## 2022-01-07 NOTE — Progress Notes (Signed)
   01/07/22 0644  15 Minute Checks  Location Cafeteria  Visual Appearance Calm  Behavior Composed  Sleep (Behavioral Health Patients Only)  Calculate sleep? (Click Yes once per 24 hr at 0600 safety check) Yes  Documented sleep last 24 hours 7.25

## 2022-01-07 NOTE — Progress Notes (Signed)
[facility] MD Progress Note  01/07/2022 7:36 AM Jenny Clark  MRN:  [id number] Principal Problem: MDD (major depressive disorder), recurrent severe, without psychosis ([place]) Diagnosis: Principal Problem:   MDD (major depressive disorder), recurrent severe, without psychosis ([place]) Active Problems:   PTSD (post-traumatic stress disorder)   Obsessive compulsive disorder   History of substance abuse ([place])   Borderline personality disorder in adult [facility])   Cocaine abuse ([place])   Heroin abuse ([place])   Benzodiazepine abuse ([place])   Reason for Admission: Jenny Clark is a 22 year old female patient with a past psychiatric history significant for MDD, PTSD, OCD, remote history of methamphetamine use, opiate abuse, and borderline personality who presents to [facility] from [facility] behavioral health urgent care voluntary for worsening depression, suicidal ideations and 4 recent suicide attempts in past week in context of relapse on crack cocaine, xanax, muscle relaxants, percocet, and heroin. This is hospitalization day 2.  Subjective:  Patient seen and assessed at bedside this morning.  Patient continues to report passive SI.  Denies HI/AVH.  Reports sleep was fair last night.  Reports opioid withdrawal symptoms are okay right now.  She felt very frustrated yesterday during visiting hours because she did not have any thing to do.  She states there was only 1 group therapy that happened yesterday.  Patient wants to continue with therapy and is frustrated that there is nothing to do at times.  Patient was very perseverative about mom being manipulative and accusing patient of putting patient's daughter in harm.  Patient states that she would be happy never to speak with mother again.  Mother had apparently said ultimatum that patient could move back home with mom but she would need to break-up with fianc.  Patient also had increased anxiety when fianc was unable to be reached prior to going to [facility].   Patient became more animated and happy when nurse came by and stated that fianc was available to talk on the phone.  Objective:  Chart Review Past 24 hours of patient's chart was reviewed.  Patient is compliant with scheduled meds. Required Agitation PRNs: denies Per RN notes, no documented behavioral issues and is attending group. Patient slept, 7.25 hours  Total Time spent with patient: 45 minutes  Past Psychiatric History:  Previous Psych Diagnoses: MDD, PTSD, GAD, Borderline personality disorder, OCD Prior inpatient treatment: 2020 admission, 2017 admission, 2016 admission.  Current/prior outpatient treatment: lamictal 50, prozac 20, propranolol 20 mg bid Psychotherapy hx: not like talking.  History of suicide: multiple since age 29 History of homicide: denies Psychiatric medication history:tegretol, prozac, cymbalta, depakote Psychiatric medication compliance history: fair Neuromodulation history: denies Current Psychiatrist: Dr. Nelida Gores Current therapist: denies  Past Medical History:  Past Medical History:  Diagnosis Date   Anxiety    Bipolar disorder ([place])    not fully diagnosed   Bronchitis    Depression    Drug abuse ([place])    meth, cocaine,molly, marijuana   Headache    migraines   Missed abortion 62/95/2841   Plica syndrome of left knee 01/2014   Pneumonia    as a child   Tachycardia     Past Surgical History:  Procedure Laterality Date   DILATION AND EVACUATION N/A 06/19/2017   Procedure: DILATATION AND EVACUATION;  Surgeon: Janyth Contes, MD;  Location: [facility];  Service: Gynecology;  Laterality: N/A;   KNEE ARTHROSCOPY Left 01/23/2014   Procedure: LEFT KNEE SCOPE WITH PLICA EXCISION;  Surgeon: Yvette Rack., MD;  Location: [place];  Service: Orthopedics;  Laterality: Left;   WISDOM TOOTH EXTRACTION  01/10/2014   Family History:  Family History  Problem Relation Age of Onset   Other Maternal Grandmother        cerebral  amyloiod angiopathy   Breast cancer Maternal Grandmother    Hypertension Maternal Grandfather     Social History:  Social History   Substance and Sexual Activity  Alcohol Use Not Currently   Comment: occas     Social History   Substance and Sexual Activity  Drug Use Yes   Types: Benzodiazepines, Cocaine, Amphetamines, Marijuana, Heroin, Fentanyl   Comment: currently using    Social History   Socioeconomic History   Marital status: Single    Spouse name: Not on file   Number of children: 0   Years of education: 14   Highest education level: Not on file  Occupational History   Occupation: [employer]  Tobacco Use   Smoking status: Some Days    Packs/day: 0.50    Years: 4.00    Total pack years: 2.00    Types: Cigarettes   Smokeless tobacco: Former  Scientific laboratory technician Use: Never used  Substance and Sexual Activity   Alcohol use: Not Currently    Comment: occas   Drug use: Yes    Types: Benzodiazepines, Cocaine, Amphetamines, Marijuana, Heroin, Fentanyl    Comment: currently using   Sexual activity: Not Currently    Comment: offered condoms  Other Topics Concern   Not on file  Social History Narrative   Lives with aunt   Caffeine use: daily   Right handed    Social Determinants of Health   Financial Resource Strain: Not on file  Food Insecurity: Food Insecurity Present (01/05/2022)   Hunger Vital Sign    Worried About Running Out of Food in the Last Year: Often true    Ran Out of Food in the Last Year: Often true  Transportation Needs: Unmet Transportation Needs (01/05/2022)   PRAPARE - Hydrologist (Medical): Yes    Lack of Transportation (Non-Medical): Yes  Physical Activity: Not on file  Stress: Not on file  Social Connections: Not on file   Additional Social History:                         Current Medications: Current Facility-Administered Medications  Medication Dose Route Frequency  Provider Last Rate Last Admin   acetaminophen (TYLENOL) tablet 650 mg  650 mg Oral Q6H PRN White, Patrice L, NP       alum & mag hydroxide-simeth (MAALOX/MYLANTA) 200-200-20 MG/5ML suspension 30 mL  30 mL Oral Q4H PRN White, Patrice L, NP       chlordiazePOXIDE (LIBRIUM) capsule 25 mg  25 mg Oral Q6H PRN France Ravens, MD       dicyclomine (BENTYL) tablet 20 mg  20 mg Oral Q6H PRN White, Patrice L, NP       [START ON 01/10/2022] fluconazole (DIFLUCAN) tablet 100 mg  100 mg Oral Daily France Ravens, MD       FLUoxetine (PROZAC) capsule 40 mg  40 mg Oral Daily France Ravens, MD       hydrOXYzine (ATARAX) tablet 25 mg  25 mg Oral TID PRN White, Patrice L, NP   25 mg at 01/06/22 2211   lamoTRIgine (LAMICTAL) tablet 25 mg  25 mg Oral Daily White, Mellody Life, NP  25 mg at 01/06/22 0810   loperamide (IMODIUM) capsule 2-4 mg  2-4 mg Oral PRN White, Patrice L, NP       magnesium hydroxide (MILK OF MAGNESIA) suspension 30 mL  30 mL Oral Daily PRN White, Patrice L, NP   30 mL at 01/06/22 0810   methocarbamol (ROBAXIN) tablet 500 mg  500 mg Oral Q8H PRN White, Patrice L, NP       naproxen (NAPROSYN) tablet 500 mg  500 mg Oral BID PRN White, Patrice L, NP       nitrofurantoin (macrocrystal-monohydrate) (MACROBID) capsule 100 mg  100 mg Oral Q12H White, Patrice L, NP   100 mg at 01/06/22 2210   ondansetron (ZOFRAN-ODT) disintegrating tablet 4 mg  4 mg Oral Q6H PRN White, Patrice L, NP   4 mg at 01/06/22 2585    Lab Results:  No results found for this or any previous visit (from the past 24 hour(s)).  Blood Alcohol level:  Lab Results  Component Value Date   ETH <10 01/04/2022   ETH 71 (H) 11/08/2021    Metabolic Disorder Labs: Lab Results  Component Value Date   HGBA1C 5.2 01/04/2022   MPG 102.54 01/04/2022   MPG 93.93 04/12/2018   No results found for: "PROLACTIN" Lab Results  Component Value Date   CHOL 169 01/04/2022   TRIG 91 01/04/2022   HDL 40 (L) 01/04/2022   CHOLHDL 4.2 01/04/2022   VLDL 18  01/04/2022   LDLCALC 111 (H) 01/04/2022   LDLCALC 93 04/12/2018    Physical Findings: CIWA:  CIWA-Ar Total: 0 COWS:  COWS Total Score: 1  Musculoskeletal: Strength & Muscle Tone: within normal limits Gait & Station: normal  Psychiatric Specialty Exam:  Presentation  General Appearance:  Appropriate for Environment; Casual   Eye Contact: Fair   Speech: Slow   Speech Volume: Decreased   Handedness: Right    Mood and Affect  Mood: Dysphoric   Affect: Depressed    Thought Process  Thought Processes: Coherent; Goal Directed   Descriptions of Associations:Intact   Orientation:Full (Time, Place and Person)   Thought Content:Logical   History of Schizophrenia/Schizoaffective disorder:No   Duration of Psychotic Symptoms:No data recorded  Hallucinations:Hallucinations: None  Ideas of Reference:None   Suicidal Thoughts:Suicidal Thoughts: Yes, Passive  Homicidal Thoughts:Homicidal Thoughts: No   Sensorium  Memory: Immediate Fair; Recent Fair; Remote Fair   Judgment: Intact   Insight: Fair    Chartered certified accountant: Fair   Attention Span: Fair   Recall: [employer] of Knowledge: Fair   Language: Fair    Psychomotor Activity  Psychomotor Activity: Psychomotor Activity: Decreased   Assets  Assets: Communication Skills; Desire for Improvement; Leisure Time; Physical Health; Social Support    Sleep  Sleep: Sleep: Fair    Physical Exam: Review of Systems  Respiratory:  Negative for shortness of breath.   Cardiovascular:  Negative for chest pain.  Gastrointestinal:  Negative for abdominal pain, constipation, diarrhea, heartburn, nausea and vomiting.  Neurological:  Negative for headaches.   Blood pressure (!) 99/59, pulse 81, temperature 98 F (36.7 C), temperature source Oral, resp. rate 20, height 5\' 5"  (1.651 m), weight 87.1 kg, SpO2 98 %, unknown if currently breastfeeding. Body mass  index is 31.95 kg/m.   ASSESSMENT AND PLAN Jenny Clark is a 22 year old female patient with a past psychiatric history significant for MDD, PTSD, OCD, remote history of methamphetamine use, opiate abuse, and borderline personality who presents to [facility] from  [facility] behavioral health urgent care voluntary for worsening depression, suicidal ideations and 4 recent suicide attempts in past week in context of relapse on crack cocaine, xanax, muscle relaxants, percocet, and heroin. This is hospitalization day 2.  PLAN Safety and Monitoring: Voluntary admission to inpatient psychiatric unit for safety, stabilization and treatment Daily contact with patient to assess and evaluate symptoms and progress in treatment Patient's case to be discussed in multi-disciplinary team meeting Observation Level : q15 minute checks Vital signs: q12 hours Precautions: suicide, elopement, and assault   Psychiatric Problems Major depressive disorder-recurrent episode, severe without psychotic features Borderline personality disorder Obsessive-compulsive disorder PTSD Stimulant use disorder Sedative use disorder Opiate use disorder -Increase Prozac to 40 mg daily for depressive symptoms -Continue lamotrigine 25 mg daily for mood lability -CIWA protocol with librium PRN -COWS protocol (last score 4) -Holding propranolol given blood pressure soft today likely secondary to vomiting.  Plan to resume at some point given she also taking it for her chest palpitations -- The risks/benefits/side-effects/alternatives to medications were discussed in detail with the patient and time was given for questions. The patient consents to medication trial.  -- Encouraged patient to participate in unit milieu and in scheduled group therapies  Medical Problems UTI Positive for E. coli on urine culture -Continue Macrobid for total of 5 days -Diflucan to start after 5 days of Macrobid when she has history of thrush  after antibiotics    PRNs Tylenol 650 mg for mild pain Maalox/Mylanta 30 mL for indigestion Hydroxyzine 25 mg tid for anxiety Milk of Magnesia 30 mL for constipation Trazodone 50 mg for sleep   4. Discharge Planning: Social work and case management to assist with discharge planning and identification of hospital follow-up needs prior to discharge Estimated LOS: 5-7 days Discharge Concerns: Need to establish a safety plan; Medication compliance and effectiveness Discharge Goals: Return home with outpatient referrals for mental health follow-up including medication management/psychotherapy     Park Pope, MD 01/07/2022, 7:36 AM

## 2022-01-07 NOTE — BHH Suicide Risk Assessment (Signed)
[facility] INPATIENT:  Family/Significant Other Suicide Prevention Education  Suicide Prevention Education:  Patient Refusal for Family/Significant Other Suicide Prevention Education: The patient Jenny Clark has refused to provide written consent for family/significant other to be provided Family/Significant Other Suicide Prevention Education during admission and/or prior to discharge.  Physician notified.  Frutoso Chase Raylinn Kosar 01/07/2022, 10:05 AM

## 2022-01-07 NOTE — Plan of Care (Signed)
Patient stated she had a hard time sleeping last night. Patient appeared flat early this morning and endorsed passive SI shrugging her shoulders and stating "I just dont care anymore." Patient denies A/V/H. Patient stated her depression was a 10/10 and anxiety 9 out of 10.  Patient states feeling irritable at times and exhausted. Patient stated "I have been in treatment since Saturday and there has only been one group, if all I was going to do was sit and take meds I would have stayed home." Patient's goal for today is to attend more groups. Patient allowed to pick some books in order to have more to do today and patient appeared in better mood later this evening. Observed socializing appropriately and eating meals adequately.    Problem: Education: Goal: Mental status will improve Outcome: Progressing   Problem: Safety: Goal: Periods of time without injury will increase Outcome: Progressing   Problem: Medication: Goal: Compliance with prescribed medication regimen will improve Outcome: Progressing

## 2022-01-07 NOTE — Plan of Care (Signed)
  Problem: Education: Goal: Knowledge of [place] [facility] Education information/materials will improve Outcome: Progressing   Problem: Education: Goal: Emotional status will improve Outcome: Progressing   Problem: Coping: Goal: Ability to verbalize frustrations and anger appropriately will improve Outcome: Progressing   Problem: Safety: Goal: Periods of time without injury will increase Outcome: Progressing   Problem: Education: Goal: Ability to make informed decisions regarding treatment will improve Outcome: Progressing   Problem: Coping: Goal: Coping ability will improve Outcome: Progressing   Problem: Self-Concept: Goal: Will verbalize positive feelings about self Outcome: Progressing

## 2022-01-08 MED ORDER — PROPRANOLOL HCL 10 MG PO TABS: 10.0000 mg | ORAL_TABLET | Freq: Two times a day (BID) | ORAL | Status: DC

## 2022-01-08 MED ORDER — POLYETHYLENE GLYCOL 3350 17 G PO PACK
17.0000 g | PACK | Freq: Every day | ORAL | Status: DC
  Administered 2022-01-08: 17 g via ORAL
  Filled 2022-01-08 (×5): qty 1

## 2022-01-08 MED ORDER — ENSURE ENLIVE PO LIQD
237.0000 mL | Freq: Two times a day (BID) | ORAL | Status: DC
  Administered 2022-01-08 – 2022-01-10 (×4): 237 mL via ORAL
  Filled 2022-01-08 (×7): qty 237

## 2022-01-08 MED ORDER — PROPRANOLOL HCL 10 MG PO TABS
5.0000 mg | ORAL_TABLET | Freq: Two times a day (BID) | ORAL | Status: DC
  Administered 2022-01-08 – 2022-01-11 (×6): 5 mg via ORAL
  Filled 2022-01-08 (×8): qty 0.5

## 2022-01-08 MED ORDER — SENNOSIDES-DOCUSATE SODIUM 8.6-50 MG PO TABS
1.0000 | ORAL_TABLET | Freq: Every day | ORAL | Status: DC
  Administered 2022-01-08 – 2022-01-11 (×4): 1 via ORAL
  Filled 2022-01-08 (×5): qty 1

## 2022-01-08 NOTE — Progress Notes (Addendum)
D: Pt reports passive SI this morning, no plan expressed at this time. Pt denied HI/AVH. Pt rated her depression a 10/10 and her anxiety a 9/10. Pt complains of constipation this morning. Pt reports she had a small bowel movement on 01/06/22 but hasn't been able to have one since. Pt has been pleasant, calm, and cooperative throughout the shift.   A: RN provided support and encouragement to patient. Pt given scheduled medications as prescribed. Pt given miralax and senna docusate for constipation. Q15 min checks verified for safety.    R: Patient verbally contracts for safety. Patient compliant with medications and treatment plan. Patient is interacting well on the unit. Pt is safe on the unit.   01/08/22 1740  Psych Admission Type (Psych Patients Only)  Admission Status Voluntary  Psychosocial Assessment  Patient Complaints Anxiety;Depression  Eye Contact Fair  Facial Expression Anxious  Affect Anxious  Speech Logical/coherent  Interaction Demanding;Assertive  Motor Activity Other (Comment) (WDL)  Appearance/Hygiene Unremarkable  Behavior Characteristics Appropriate to situation;Cooperative  Mood Depressed;Anxious  Thought Process  Coherency WDL  Content WDL  Delusions None reported or observed  Perception WDL  Hallucination None reported or observed  Judgment Impaired  Confusion None  Danger to Self  Current suicidal ideation? Passive  Description of Suicide Plan No plan  Self-Injurious Behavior No self-injurious ideation or behavior indicators observed or expressed   Agreement Not to Harm Self Yes  Description of Agreement Pt verbally contracts for safety  Danger to Others  Danger to Others None reported or observed

## 2022-01-08 NOTE — Progress Notes (Signed)
   01/08/22 0630  15 Minute Checks  Location Dayroom  Visual Appearance Calm  Behavior Composed  Sleep (Behavioral Health Patients Only)  Calculate sleep? (Click Yes once per 24 hr at 0600 safety check) Yes  Documented sleep last 24 hours 7.75

## 2022-01-08 NOTE — BHH Counselor (Signed)
CSW provided the Pt with a packet that contains information including shelter and housing resources, free and reduced price food information, clothing resources, crisis center information, a GoodRX card, and suicide prevention information.   

## 2022-01-08 NOTE — BHH Group Notes (Signed)
[facility] Group Notes:  (Nursing/MHT/Case Management/Adjunct)  Date:  01/08/2022  Time:  9:55 AM  Type of Therapy:  Group Therapy  Participation Level:  Active  Participation Quality:  Appropriate  Affect:  Appropriate  Cognitive:  Alert  Insight:  Appropriate  Engagement in Group:  Engaged  Modes of Intervention:  Discussion  Summary of Progress/Problems: Pt states her goal for today is to "stay out of my room and stay out of bed".   Annye Rusk 01/08/2022, 9:55 AM

## 2022-01-08 NOTE — Group Note (Signed)
Recreation Therapy Group Note   Group Topic:Other  Group Date: 01/08/2022 Start Time: 1400 End Time: 1440 Facilitators: Gaylan Fauver-McCall, LRT,CTRS Location: [address] Dayroom   Goal Area(s) Addresses:  Patient will engage in pro-social way in music group.  Patient will follow directions of drum leader on the first prompt. Patient will demonstrate no behavioral issues during group.  Patient will identify if a reduction in stress level occurs as a result of participation in therapeutic drum circle.   Activity Description/Intervention: Therapeutic Drumming. Patients with peers and staff were given the opportunity to engage in a leader facilitated [place] with staff from the [employer], in partnership with [employer]. Nurse, adult and trained [employer], Devin Going leading with LRT observing and documenting intervention and pt response. This evidenced-based practice targets 7 areas of health and wellbeing in the human experience including: stress-reduction, exercise, self-expression, camaraderie/support, nurturing, spirituality, and music-making (leisure).    Affect/Mood: N/A   Participation Level: Did not attend    Clinical Observations/Individualized Feedback:     Plan: Continue to engage patient in RT group sessions 2-3x/week.   Mercades Bajaj-McCall, LRT,CTRS 01/08/2022 3:49 PM

## 2022-01-08 NOTE — Progress Notes (Signed)
The patient attended the evening N.A.meeting and was appropriate.  

## 2022-01-08 NOTE — Plan of Care (Signed)
  Problem: Education: Goal: Knowledge of [place] [facility] Education information/materials will improve Outcome: Progressing Goal: Mental status will improve Outcome: Progressing   Problem: Activity: Goal: Interest or engagement in activities will improve Outcome: Progressing   Problem: Coping: Goal: Ability to demonstrate self-control will improve Outcome: Progressing   Problem: Health Behavior/Discharge Planning: Goal: Compliance with treatment plan for underlying cause of condition will improve Outcome: Progressing   Problem: Safety: Goal: Periods of time without injury will increase Outcome: Progressing   Problem: Coping: Goal: Coping ability will improve Outcome: Progressing   Problem: Self-Concept: Goal: Will verbalize positive feelings about self Outcome: Progressing

## 2022-01-08 NOTE — Plan of Care (Signed)
  Problem: Education: Goal: Mental status will improve Outcome: Progressing Goal: Verbalization of understanding the information provided will improve Outcome: Progressing   Problem: Activity: Goal: Sleeping patterns will improve Outcome: Progressing   Problem: Coping: Goal: Ability to demonstrate self-control will improve Outcome: Progressing   Problem: Safety: Goal: Periods of time without injury will increase Outcome: Progressing   Problem: Coping: Goal: Coping ability will improve Outcome: Progressing   Problem: Self-Concept: Goal: Will verbalize positive feelings about self Outcome: Progressing

## 2022-01-08 NOTE — BHH Counselor (Signed)
CSW spoke with the Pt about discharge planning.  The Pt stated that she has been speaking with a friend and may be able to live with her friend's mother after discharge.  She states that her friend is coming to visit tonight and she will get more information then.  CSW offered a packet with information for local shelters and resources.  The Pt accept this packet and states that if she cannot stay with her friend's mother then she will begin looking at shelter options.

## 2022-01-08 NOTE — Progress Notes (Addendum)
[facility] MD Progress Note  01/08/2022 12:23 PM Jenny Clark  MRN:  [id number] Principal Problem: MDD (major depressive disorder), recurrent severe, without psychosis ([place]) Diagnosis: Principal Problem:   MDD (major depressive disorder), recurrent severe, without psychosis ([place]) Active Problems:   PTSD (post-traumatic stress disorder)   Obsessive compulsive disorder   History of substance abuse ([place])   Borderline personality disorder in adult [facility])   Cocaine abuse ([place])   Heroin abuse ([place])   Benzodiazepine abuse ([place])   Reason for Admission: LAKETRA BOWDISH is a 22 year old female patient with a past psychiatric history significant for MDD, PTSD, OCD, remote history of methamphetamine use, opiate abuse, and borderline personality who presents to [facility] from [facility] behavioral health urgent care voluntary for worsening depression, suicidal ideations and 4 recent suicide attempts in past week in context of relapse on crack cocaine, xanax, muscle relaxants, percocet, and heroin. This is hospitalization day 3.  Subjective:  Patient seen and assessed at bedside this morning.  Patient continues to report passive SI.  Denies HI/AVH.  Reports sleep was fair last night.  Reports opioid withdrawal symptoms are manageable but complains of constipation since 1/1. She was glad there was more group therapy. Mood has mildly improved after speaking with her fiance. She found out she may be able to live with friend's mother and currently is working on her dispo. Patient less perseverative about mom being manipulative but still frustrated because mom is a major trigger. She states this hospitalization is different from previous one because mother was more supportive last time. Patient feels she has some social support but at times but primarily from fiance. We discussed some dispo planning and likely plan will be to discharge around the weekend.  Objective:  Chart Review Past 24 hours of patient's chart was  reviewed.  Patient is compliant with scheduled meds. Required Agitation PRNs: denies Per RN notes, no documented behavioral issues and is attending group. Patient slept, 7.25 hours  Total Time spent with patient: 45 minutes  Past Psychiatric History:  Previous Psych Diagnoses: MDD, PTSD, GAD, Borderline personality disorder, OCD Prior inpatient treatment: 2020 admission, 2017 admission, 2016 admission.  Current/prior outpatient treatment: lamictal 50, prozac 20, propranolol 20 mg bid Psychotherapy hx: not like talking.  History of suicide: multiple since age 58 History of homicide: denies Psychiatric medication history:tegretol, prozac, cymbalta, depakote Psychiatric medication compliance history: fair Neuromodulation history: denies Current Psychiatrist: Dr. Nelida Gores Current therapist: denies  Past Medical History:  Past Medical History:  Diagnosis Date   Anxiety    Bipolar disorder ([place])    not fully diagnosed   Bronchitis    Depression    Drug abuse ([place])    meth, cocaine,molly, marijuana   Headache    migraines   Missed abortion 98/92/1194   Plica syndrome of left knee 01/2014   Pneumonia    as a child   Tachycardia     Past Surgical History:  Procedure Laterality Date   DILATION AND EVACUATION N/A 06/19/2017   Procedure: DILATATION AND EVACUATION;  Surgeon: Janyth Contes, MD;  Location: [facility];  Service: Gynecology;  Laterality: N/A;   KNEE ARTHROSCOPY Left 01/23/2014   Procedure: LEFT KNEE SCOPE WITH PLICA EXCISION;  Surgeon: Yvette Rack., MD;  Location: [facility];  Service: Orthopedics;  Laterality: Left;   WISDOM TOOTH EXTRACTION  01/10/2014   Family History:  Family History  Problem Relation Age of Onset   Other Maternal Grandmother  cerebral amyloiod angiopathy   Breast cancer Maternal Grandmother    Hypertension Maternal Grandfather     Social History:  Social History   Substance and Sexual Activity  Alcohol Use Not  Currently   Comment: occas     Social History   Substance and Sexual Activity  Drug Use Yes   Types: Benzodiazepines, Cocaine, Amphetamines, Marijuana, Heroin, Fentanyl   Comment: currently using    Social History   Socioeconomic History   Marital status: Single    Spouse name: Not on file   Number of children: 0   Years of education: 14   Highest education level: Not on file  Occupational History   Occupation: [employer]  Tobacco Use   Smoking status: Some Days    Packs/day: 0.50    Years: 4.00    Total pack years: 2.00    Types: Cigarettes   Smokeless tobacco: Former  Building services engineer Use: Never used  Substance and Sexual Activity   Alcohol use: Not Currently    Comment: occas   Drug use: Yes    Types: Benzodiazepines, Cocaine, Amphetamines, Marijuana, Heroin, Fentanyl    Comment: currently using   Sexual activity: Not Currently    Comment: offered condoms  Other Topics Concern   Not on file  Social History Narrative   Lives with aunt   Caffeine use: daily   Right handed    Social Determinants of Health   Financial Resource Strain: Not on file  Food Insecurity: Food Insecurity Present (01/05/2022)   Hunger Vital Sign    Worried About Running Out of Food in the Last Year: Often true    Ran Out of Food in the Last Year: Often true  Transportation Needs: Unmet Transportation Needs (01/05/2022)   PRAPARE - Administrator, Civil Service (Medical): Yes    Lack of Transportation (Non-Medical): Yes  Physical Activity: Not on file  Stress: Not on file  Social Connections: Not on file   Additional Social History:                         Current Medications: Current Facility-Administered Medications  Medication Dose Route Frequency Provider Last Rate Last Admin   acetaminophen (TYLENOL) tablet 650 mg  650 mg Oral Q6H PRN White, Patrice L, NP       alum & mag hydroxide-simeth (MAALOX/MYLANTA) 200-200-20 MG/5ML suspension  30 mL  30 mL Oral Q4H PRN White, Patrice L, NP       chlordiazePOXIDE (LIBRIUM) capsule 25 mg  25 mg Oral Q6H PRN Park Pope, MD       dicyclomine (BENTYL) tablet 20 mg  20 mg Oral Q6H PRN White, Patrice L, NP       [START ON 01/10/2022] fluconazole (DIFLUCAN) tablet 100 mg  100 mg Oral Daily Park Pope, MD       FLUoxetine (PROZAC) capsule 40 mg  40 mg Oral Daily Park Pope, MD   40 mg at 01/08/22 5027   hydrOXYzine (ATARAX) tablet 25 mg  25 mg Oral TID PRN Liborio Nixon L, NP   25 mg at 01/07/22 2107   lamoTRIgine (LAMICTAL) tablet 25 mg  25 mg Oral Daily White, Patrice L, NP   25 mg at 01/08/22 0805   loperamide (IMODIUM) capsule 2-4 mg  2-4 mg Oral PRN White, Patrice L, NP       magnesium hydroxide (MILK OF MAGNESIA) suspension 30 mL  30 mL  Oral Daily PRN Darrol Angel L, NP   30 mL at 01/07/22 2108   methocarbamol (ROBAXIN) tablet 500 mg  500 mg Oral Q8H PRN White, Patrice L, NP       naproxen (NAPROSYN) tablet 500 mg  500 mg Oral BID PRN White, Patrice L, NP       nitrofurantoin (macrocrystal-monohydrate) (MACROBID) capsule 100 mg  100 mg Oral Q12H White, Patrice L, NP   100 mg at 01/08/22 0806   ondansetron (ZOFRAN-ODT) disintegrating tablet 4 mg  4 mg Oral Q6H PRN White, Patrice L, NP   4 mg at 01/06/22 3825   polyethylene glycol (MIRALAX / GLYCOLAX) packet 17 g  17 g Oral Daily France Ravens, MD       senna-docusate (Senokot-S) tablet 1 tablet  1 tablet Oral Daily France Ravens, MD        Lab Results:  No results found for this or any previous visit (from the past 24 hour(s)).  Blood Alcohol level:  Lab Results  Component Value Date   ETH <10 01/04/2022   ETH 71 (H) 05/39/7673    Metabolic Disorder Labs: Lab Results  Component Value Date   HGBA1C 5.2 01/04/2022   MPG 102.54 01/04/2022   MPG 93.93 04/12/2018   No results found for: "PROLACTIN" Lab Results  Component Value Date   CHOL 169 01/04/2022   TRIG 91 01/04/2022   HDL 40 (L) 01/04/2022   CHOLHDL 4.2 01/04/2022   VLDL  18 01/04/2022   LDLCALC 111 (H) 01/04/2022   [place] 93 04/12/2018    Physical Findings: CIWA:  CIWA-Ar Total: 0 COWS:  COWS Total Score: 0  Musculoskeletal: Strength & Muscle Tone: within normal limits Gait & Station: normal  Psychiatric Specialty Exam:  Presentation  General Appearance:  Appropriate for Environment; Casual   Eye Contact: Minimal   Speech: Slow   Speech Volume: Decreased   Handedness: Right    Mood and Affect  Mood: Depressed; Worthless   Affect: Interior and spatial designer Processes: Coherent; Goal Directed   Descriptions of Associations:Intact   Orientation:Full (Time, Place and Person)   Thought Content:Perseveration   History of Schizophrenia/Schizoaffective disorder:No  Hallucinations:Hallucinations: None  Ideas of Reference:None   Suicidal Thoughts:Suicidal Thoughts: Yes, Passive  Homicidal Thoughts:Homicidal Thoughts: No   Sensorium  Memory: Immediate Fair; Recent Fair   Judgment: Intact   Insight: Fair    Materials engineer: Fair   Attention Span: Fair   Recall: [employer] of Knowledge: Fair   Language: Fair    Psychomotor Activity  Psychomotor Activity: Psychomotor Activity: Decreased   Assets  Assets: Communication Skills; Desire for Improvement; Leisure Time; Physical Health; Social Support    Sleep  Sleep: Sleep: Fair    Physical Exam: Review of Systems  Respiratory:  Negative for shortness of breath.   Cardiovascular:  Negative for chest pain.  Gastrointestinal:  Negative for abdominal pain, constipation, diarrhea, heartburn, nausea and vomiting.  Neurological:  Negative for headaches.   Blood pressure 102/65, pulse 80, temperature 98.4 F (36.9 C), temperature source Oral, resp. rate 16, height 5\' 5"  (1.651 m), weight 87.1 kg, SpO2 97 %, unknown if currently breastfeeding. Body mass index is 31.95 kg/m.   ASSESSMENT AND  PLAN ANJALI MANZELLA is a 22 year old female patient with a past psychiatric history significant for MDD, PTSD, OCD, remote history of methamphetamine use, opiate abuse, and borderline personality who presents to [facility] from [facility] behavioral health urgent care voluntary  for worsening depression, suicidal ideations and 4 recent suicide attempts in past week in context of relapse on crack cocaine, xanax, muscle relaxants, percocet, and heroin. This is hospitalization day 3.  PLAN Safety and Monitoring: Voluntary admission to inpatient psychiatric unit for safety, stabilization and treatment Daily contact with patient to assess and evaluate symptoms and progress in treatment Patient's case to be discussed in multi-disciplinary team meeting Observation Level : q15 minute checks Vital signs: q12 hours Precautions: suicide, elopement, and assault   Psychiatric Problems Major depressive disorder-recurrent episode, severe without psychotic features Borderline personality disorder Obsessive-compulsive disorder PTSD Stimulant use disorder Sedative use disorder Opiate use disorder -Increase Prozac to 40 mg daily for depressive symptoms -Continue lamotrigine 25 mg daily for mood lability -CIWA protocol with librium PRN -COWS protocol (last score 4) -Restart propranolol 5 mg bid for chest palpitations and anxiety. -Encourage PO fluid and monitor BP -- The risks/benefits/side-effects/alternatives to medications were discussed in detail with the patient and time was given for questions. The patient consents to medication trial.  -- Encouraged patient to participate in unit milieu and in scheduled group therapies  Medical Problems UTI Positive for E. coli on urine culture -Continue Macrobid for total of 5 days -Diflucan to start after 5 days of Macrobid when she has history of thrush after antibiotics    PRNs Tylenol 650 mg for mild pain Maalox/Mylanta 30 mL for indigestion Hydroxyzine  25 mg tid for anxiety Milk of Magnesia 30 mL for constipation Trazodone 50 mg for sleep   4. Discharge Planning: Social work and case management to assist with discharge planning and identification of hospital follow-up needs prior to discharge Estimated LOS: 5-7 days Discharge Concerns: Need to establish a safety plan; Medication compliance and effectiveness Discharge Goals: Return home with outpatient referrals for mental health follow-up including medication management/psychotherapy     Park Pope, MD 01/08/2022, 12:23 PM

## 2022-01-09 NOTE — Progress Notes (Signed)
   01/09/22 8250  15 Minute Checks  Location Bedroom  Visual Appearance Calm  Behavior Composed  Sleep (Behavioral Health Patients Only)  Calculate sleep? (Click Yes once per 24 hr at 0600 safety check) Yes  Documented sleep last 24 hours 7.5

## 2022-01-09 NOTE — [facility] Group Notes (Signed)
Goals Group 9 out of 10 Goal: stay out of room

## 2022-01-09 NOTE — Progress Notes (Signed)
[facility] MD Progress Note  01/09/2022 2:33 PM Jenny Clark  MRN:  [id number] Principal Problem: MDD (major depressive disorder), recurrent severe, without psychosis ([place]) Diagnosis: Principal Problem:   MDD (major depressive disorder), recurrent severe, without psychosis ([place]) Active Problems:   PTSD (post-traumatic stress disorder)   Obsessive compulsive disorder   History of substance abuse ([place])   Borderline personality disorder in adult [facility])   Cocaine abuse ([place])   Heroin abuse ([place])   Benzodiazepine abuse ([place])   Reason for Admission: Jenny Clark is a 22 year old female patient with a past psychiatric history significant for MDD, PTSD, OCD, remote history of methamphetamine use, opiate abuse, and borderline personality who presents to [facility] from [facility] behavioral health urgent care voluntary for worsening depression, suicidal ideations and 4 recent suicide attempts in past week in context of relapse on crack cocaine, xanax, muscle relaxants, percocet, and heroin. This is hospitalization day 5  Subjective:  Patient seen and assessed at bedside this morning.  The patient continues to report fair sleep and reports improving symptoms.  She denies any active symptoms of depression or withdrawals.  She has been attending groups and reports that her mood is improved.  She denies any active SI/HI/AVH.  She is able to contract for safety.  She feels that she could go to a friend's mother's house to stay at least for the short term.  She feels that she has had job placement and hopes to support herself soon.  Apparently her mother is supportive.  Plan is to continue treatment and plan for discharge over the weekend.  Objective:  Chart Review Past 24 hours of patient's chart was reviewed.  Patient is compliant with scheduled meds. Required Agitation PRNs: denies Per RN notes, no documented behavioral issues and is attending group. Patient slept, not documented.  She reports fair  sleep.  Total Time spent with patient: 30 minutes  Past Psychiatric History:  Previous Psych Diagnoses: MDD, PTSD, GAD, Borderline personality disorder, OCD Prior inpatient treatment: 2020 admission, 2017 admission, 2016 admission.  Current/prior outpatient treatment: lamictal 50, prozac 20, propranolol 20 mg bid Psychotherapy hx: not like talking.  History of suicide: multiple since age 12 History of homicide: denies Psychiatric medication history:tegretol, prozac, cymbalta, depakote Psychiatric medication compliance history: fair Neuromodulation history: denies Current Psychiatrist: Dr. Nelida Gores Current therapist: denies  Past Medical History:  Past Medical History:  Diagnosis Date   Anxiety    Bipolar disorder ([place])    not fully diagnosed   Bronchitis    Depression    Drug abuse ([place])    meth, cocaine,molly, marijuana   Headache    migraines   Missed abortion 62/95/2841   Plica syndrome of left knee 01/2014   Pneumonia    as a child   Tachycardia     Past Surgical History:  Procedure Laterality Date   DILATION AND EVACUATION N/A 06/19/2017   Procedure: DILATATION AND EVACUATION;  Surgeon: Janyth Contes, MD;  Location: [place];  Service: Gynecology;  Laterality: N/A;   KNEE ARTHROSCOPY Left 01/23/2014   Procedure: LEFT KNEE SCOPE WITH PLICA EXCISION;  Surgeon: Yvette Rack., MD;  Location: [place];  Service: Orthopedics;  Laterality: Left;   WISDOM TOOTH EXTRACTION  01/10/2014   Family History:  Family History  Problem Relation Age of Onset   Other Maternal Grandmother        cerebral amyloiod angiopathy   Breast cancer Maternal Grandmother    Hypertension Maternal Grandfather  Social History:  Social History   Substance and Sexual Activity  Alcohol Use Not Currently   Comment: occas     Social History   Substance and Sexual Activity  Drug Use Yes   Types: Benzodiazepines, Cocaine, Amphetamines, Marijuana, Heroin, Fentanyl    Comment: currently using    Social History   Socioeconomic History   Marital status: Single    Spouse name: Not on file   Number of children: 0   Years of education: 14   Highest education level: Not on file  Occupational History   Occupation: [employer]  Tobacco Use   Smoking status: Some Days    Packs/day: 0.50    Years: 4.00    Total pack years: 2.00    Types: Cigarettes   Smokeless tobacco: Former  Scientific laboratory technician Use: Never used  Substance and Sexual Activity   Alcohol use: Not Currently    Comment: occas   Drug use: Yes    Types: Benzodiazepines, Cocaine, Amphetamines, Marijuana, Heroin, Fentanyl    Comment: currently using   Sexual activity: Not Currently    Comment: offered condoms  Other Topics Concern   Not on file  Social History Narrative   Lives with aunt   Caffeine use: daily   Right handed    Social Determinants of Health   Financial Resource Strain: Not on file  Food Insecurity: Food Insecurity Present (01/05/2022)   Hunger Vital Sign    Worried About Running Out of Food in the Last Year: Often true    Ran Out of Food in the Last Year: Often true  Transportation Needs: Unmet Transportation Needs (01/05/2022)   PRAPARE - Hydrologist (Medical): Yes    Lack of Transportation (Non-Medical): Yes  Physical Activity: Not on file  Stress: Not on file  Social Connections: Not on file   Additional Social History:                         Current Medications: Current Facility-Administered Medications  Medication Dose Route Frequency Provider Last Rate Last Admin   acetaminophen (TYLENOL) tablet 650 mg  650 mg Oral Q6H PRN White, Patrice L, NP       alum & mag hydroxide-simeth (MAALOX/MYLANTA) 200-200-20 MG/5ML suspension 30 mL  30 mL Oral Q4H PRN White, Patrice L, NP       dicyclomine (BENTYL) tablet 20 mg  20 mg Oral Q6H PRN White, Patrice L, NP       feeding supplement (ENSURE ENLIVE / ENSURE  PLUS) liquid 237 mL  237 mL Oral BID BM France Ravens, MD   237 mL at 01/09/22 1019   [START ON 01/10/2022] fluconazole (DIFLUCAN) tablet 100 mg  100 mg Oral Daily France Ravens, MD       FLUoxetine (PROZAC) capsule 40 mg  40 mg Oral Daily France Ravens, MD   40 mg at 01/09/22 0347   hydrOXYzine (ATARAX) tablet 25 mg  25 mg Oral TID PRN Darrol Angel L, NP   25 mg at 01/08/22 2120   lamoTRIgine (LAMICTAL) tablet 25 mg  25 mg Oral Daily White, Patrice L, NP   25 mg at 01/09/22 0818   loperamide (IMODIUM) capsule 2-4 mg  2-4 mg Oral PRN White, Patrice L, NP       magnesium hydroxide (MILK OF MAGNESIA) suspension 30 mL  30 mL Oral Daily PRN White, Patrice L, NP   30 mL  at 01/07/22 2108   methocarbamol (ROBAXIN) tablet 500 mg  500 mg Oral Q8H PRN White, Patrice L, NP       naproxen (NAPROSYN) tablet 500 mg  500 mg Oral BID PRN White, Patrice L, NP       nitrofurantoin (macrocrystal-monohydrate) (MACROBID) capsule 100 mg  100 mg Oral Q12H White, Patrice L, NP   100 mg at 01/09/22 0817   ondansetron (ZOFRAN-ODT) disintegrating tablet 4 mg  4 mg Oral Q6H PRN White, Patrice L, NP   4 mg at 01/06/22 0852   polyethylene glycol (MIRALAX / GLYCOLAX) packet 17 g  17 g Oral Daily Park Pope, MD   17 g at 01/08/22 1242   propranolol (INDERAL) tablet 5 mg  5 mg Oral BID Park Pope, MD   5 mg at 01/09/22 8337   senna-docusate (Senokot-S) tablet 1 tablet  1 tablet Oral Daily Park Pope, MD   1 tablet at 01/09/22 4451    Lab Results:  No results found for this or any previous visit (from the past 24 hour(s)).  Blood Alcohol level:  Lab Results  Component Value Date   ETH <10 01/04/2022   ETH 71 (H) 11/08/2021    Metabolic Disorder Labs: Lab Results  Component Value Date   HGBA1C 5.2 01/04/2022   MPG 102.54 01/04/2022   MPG 93.93 04/12/2018   No results found for: "PROLACTIN" Lab Results  Component Value Date   CHOL 169 01/04/2022   TRIG 91 01/04/2022   HDL 40 (L) 01/04/2022   CHOLHDL 4.2 01/04/2022   VLDL  18 01/04/2022   LDLCALC 111 (H) 01/04/2022   LDLCALC 93 04/12/2018    Physical Findings: CIWA:  CIWA-Ar Total: 0 COWS:  COWS Total Score: 0  Musculoskeletal: Strength & Muscle Tone: within normal limits Gait & Station: normal  Psychiatric Specialty Exam:  Presentation  General Appearance:  Appropriate for Environment; Casual   Eye Contact: Minimal   Speech: Slow   Speech Volume: Decreased   Handedness: Right    Mood and Affect  Mood: Depressed; Worthless   Affect: Runner, broadcasting/film/video Processes: Coherent; Goal Directed   Descriptions of Associations:Intact   Orientation:Full (Time, Place and Person)   Thought Content:Perseveration   History of Schizophrenia/Schizoaffective disorder:No  Hallucinations:No data recorded  Ideas of Reference:None   Suicidal Thoughts:No data recorded  Homicidal Thoughts:No data recorded   Sensorium  Memory: Immediate Fair; Recent Fair   Judgment: Intact   Insight: Fair    Chartered certified accountant: Fair   Attention Span: Fair   Recall: [employer] of Knowledge: Fair   Language: Fair    Psychomotor Activity  Psychomotor Activity: No data recorded   Assets  Assets: Communication Skills; Desire for Improvement; Leisure Time; Physical Health; Social Support    Sleep  Sleep: No data recorded    Physical Exam: Review of Systems  Respiratory:  Negative for shortness of breath.   Cardiovascular:  Negative for chest pain.  Gastrointestinal:  Negative for abdominal pain, constipation, diarrhea, heartburn, nausea and vomiting.  Neurological:  Negative for headaches.   Blood pressure 97/68, pulse 90, temperature 97.7 F (36.5 C), temperature source Oral, resp. rate 16, height 5\' 5"  (1.651 m), weight 87.1 kg, SpO2 97 %, unknown if currently breastfeeding. Body mass index is 31.95 kg/m.   ASSESSMENT AND PLAN Jenny Clark is a 22 year old  female patient with a past psychiatric history significant for MDD, PTSD, OCD, remote history of methamphetamine  use, opiate abuse, and borderline personality who presents to [facility] from [facility] behavioral health urgent care voluntary for worsening depression, suicidal ideations and 4 recent suicide attempts in past week in context of relapse on crack cocaine, xanax, muscle relaxants, percocet, and heroin. This is hospitalization day 4.  PLAN Safety and Monitoring: Voluntary admission to inpatient psychiatric unit for safety, stabilization and treatment Daily contact with patient to assess and evaluate symptoms and progress in treatment Patient's case to be discussed in multi-disciplinary team meeting Observation Level : q15 minute checks Vital signs: q12 hours Precautions: suicide, elopement, and assault   Psychiatric Problems Major depressive disorder-recurrent episode, severe without psychotic features Borderline personality disorder Obsessive-compulsive disorder PTSD Stimulant use disorder Sedative use disorder Opiate use disorder -Increase Prozac to 40 mg daily for depressive symptoms -Continue lamotrigine 25 mg daily for mood lability -CIWA protocol with librium PRN -COWS protocol (last score 4) -Restart propranolol 5 mg bid for chest palpitations and anxiety. -Encourage PO fluid and monitor BP -- The risks/benefits/side-effects/alternatives to medications were discussed in detail with the patient and time was given for questions. The patient consents to medication trial.  -- Encouraged patient to participate in unit milieu and in scheduled group therapies  Medical Problems UTI Positive for E. coli on urine culture -Continue Macrobid for total of 5 days -Diflucan to start after 5 days of Macrobid when she has history of thrush after antibiotics    PRNs Tylenol 650 mg for mild pain Maalox/Mylanta 30 mL for indigestion Hydroxyzine 25 mg tid for anxiety Milk of  Magnesia 30 mL for constipation Trazodone 50 mg for sleep   4. Discharge Planning: Social work and case management to assist with discharge planning and identification of hospital follow-up needs prior to discharge Estimated LOS: 5-7 days Discharge Concerns: Need to establish a safety plan; Medication compliance and effectiveness Discharge Goals: Return home with outpatient referrals for mental health follow-up including medication management/psychotherapy  Total Time Spent in Direct Patient Care:  I personally spent 30 minutes on the unit in direct patient care. The direct patient care time included face-to-face time with the patient, reviewing the patient's chart, communicating with other professionals, and coordinating care. Greater than 50% of this time was spent in counseling or coordinating care with the patient regarding goals of hospitalization, psycho-education, and discharge planning needs.   Rex Kras, MD 01/09/2022, 2:33 PM

## 2022-01-09 NOTE — BHH Suicide Risk Assessment (Signed)
[place] INPATIENT:  Family/Significant Other Suicide Prevention Education  Suicide Prevention Education:  Education Completed; Wess Botts [phone] (Friend) has been identified by the patient as the family member/significant other with whom the patient will be residing, and identified as the person(s) who will aid the patient in the event of a mental health crisis (suicidal ideations/suicide attempt).  With written consent from the patient, the family member/significant other has been provided the following suicide prevention education, prior to the and/or following the discharge of the patient.  The suicide prevention education provided includes the following: Suicide risk factors Suicide prevention and interventions National Suicide Hotline telephone number [facility] assessment telephone number [facility] Emergency Assistance [place] and/or Residential Mobile Crisis Unit telephone number  Request made of family/significant other to: Remove weapons (e.g., guns, rifles, knives), all items previously/currently identified as safety concern.   Remove drugs/medications (over-the-counter, prescriptions, illicit drugs), all items previously/currently identified as a safety concern.  The family member/significant other verbalizes understanding of the suicide prevention education information provided.  The family member/significant other agrees to remove the items of safety concern listed above.  CSW spoke with Ms. Benita Stabile who confirms that her friend will be staying with her mother after discharge.  She states that there are no firearms or weapons in her mother's home.  She states that she will be staying there as well to be a support for her friend.  She states that she will also be the individual that will pick the Pt up from the hospital at discharge.  Ms. Benita Stabile states that she has no further questions or concerns. CSW completed SPE with Ms. Benita Stabile.   Frutoso Chase  Jeoffrey Eleazer 01/09/2022, 2:08 PM

## 2022-01-09 NOTE — BHH Counselor (Addendum)
CSW spoke with the Pt about discharge planning.  The Pt states that she met with her friend during visitation on 01/08/2022.  She states that her friend's mother will allow her to come to her home after discharge.  The Pt states that her friend will also be staying there to be a support. She states that her friends name and address is:  Wess Botts, [address] [place] [address].  CSW will call to confirm this information.

## 2022-01-09 NOTE — Group Note (Signed)
LCSW Group Therapy Note   Group Date: 01/09/2022 Start Time: 1100 End Time: 1200  Type of Therapy and Topic:  Group Therapy:  Strengths Exploration  Participation Level: Did Not Attend  Description of Group: This group allows individuals to explore their strengths, learn to use strengths in new ways to improve well-being. Strengths-based interventions involve identifying strengths, understanding how they are used, and learning new ways to apply them. Individuals will identify their strengths, and then explore their roles in different areas of life (relationships, professional life, and personal fulfillment). Individuals will think about ways in which they currently use their strengths, along with new ways they could begin using them.   Therapeutic Goals Patient will verbalize two of their strengths Patient will identify how their strengths are currently used Patient will identify two new ways to apply their strengths  Patients will create a plan to apply their strengths in their daily lives     Summary of Patient Progress:  Did not attend    Therapeutic Modalities Cognitive Behavioral Therapy Motivational Interviewing  Maurisa Tesmer M Kiannah Grunow, LCSWA 01/09/2022  2:40 PM    

## 2022-01-09 NOTE — Progress Notes (Signed)
D: Patient is alert, oriented, pleasant, and cooperative. Denies SI, HI, AVH, and verbally contracts for safety. Patient reports she slept poorly last night without sleeping medication. Patient reports her appetite as good, energy level as low, and concentration as poor. Patient rates her depression 6/10, hopelessness 4/10, and anxiety 2/10. Patient denies physical symptoms/pain. Patient states her goal is "to stay out of bed and stay awake by reading and walking".    A: Scheduled medications administered per MD order. Support provided. Patient educated on safety on the unit and medications. Routine safety checks every 15 minutes. Patient stated understanding to tell nurse about any new physical symptoms. Patient understands to tell staff of any needs.     R: No adverse drug reactions noted. Patient verbally contracts for safety. Patient remains safe at this time and will continue to monitor.   01/09/22 0900  Psych Admission Type (Psych Patients Only)  Admission Status Voluntary  Psychosocial Assessment  Patient Complaints Depression  Eye Contact Fair  Facial Expression Anxious  Affect Depressed;Anxious  Speech Logical/coherent  Interaction Assertive  Motor Activity Other (Comment) (WDL)  Appearance/Hygiene Unremarkable  Behavior Characteristics Cooperative;Appropriate to situation  Mood Depressed  Thought Process  Coherency WDL  Content WDL  Delusions None reported or observed  Perception WDL  Hallucination None reported or observed  Judgment Impaired  Confusion None  Danger to Self  Current suicidal ideation? Denies  Self-Injurious Behavior No self-injurious ideation or behavior indicators observed or expressed   Agreement Not to Harm Self Yes  Description of Agreement verbal contract for safety  Danger to Others  Danger to Others None reported or observed

## 2022-01-10 ENCOUNTER — Encounter (HOSPITAL_COMMUNITY): Payer: Self-pay

## 2022-01-10 MED ORDER — TRAZODONE HCL 50 MG PO TABS
50.0000 mg | ORAL_TABLET | Freq: Every evening | ORAL | Status: DC | PRN
  Administered 2022-01-10: 50 mg via ORAL
  Filled 2022-01-10: qty 1

## 2022-01-10 NOTE — Progress Notes (Signed)
[facility] MD Progress Note  01/10/2022 7:18 AM Jenny Clark  MRN:  [id number] Principal Problem: MDD (major depressive disorder), recurrent severe, without psychosis (HCC) Diagnosis: Principal Problem:   MDD (major depressive disorder), recurrent severe, without psychosis (HCC) Active Problems:   PTSD (post-traumatic stress disorder)   Obsessive compulsive disorder   History of substance abuse (HCC)   Borderline personality disorder in adult (HCC)   Cocaine abuse (HCC)   Heroin abuse (HCC)   Benzodiazepine abuse (HCC)   Reason for Admission: Jenny Clark is a 22 year old female patient with a past psychiatric history significant for MDD, PTSD, OCD, remote history of methamphetamine use, opiate abuse, and borderline personality who presents to [facility] from [facility] behavioral health urgent care voluntary for worsening depression, suicidal ideations and 4 recent suicide attempts in past week in context of relapse on crack cocaine, xanax, muscle relaxants, percocet, and heroin. This is hospitalization day 5  Subjective:  Patient was seen in the milieu this morning.  On assessment today, patient was doing well.  She reports sleeping and eating well.  Reports attending group yesterday and reading books throughout her stay here.  She denies adverse effects from her medications.  Says that the combination of Prozac, Lamictal, and propranolol have helped her in the past.  She intends on going to her friend's house after discharge.  Denies SI, HI, AVH.  She reports improving depression and anxiety and feels that she can be discharged soon.  Objective:  Chart Review The patient's chart was reviewed and nursing notes were reviewed. The patient's case was discussed in multidisciplinary team meeting. Per [facility], patient was taking medications appropriately. Per nursing, patient is calm and cooperative and attended 1 group sessions. The following as needed medications were given: none   Total Time spent  with patient: 30 minutes  Past Psychiatric History:  Previous Psych Diagnoses: MDD, PTSD, GAD, Borderline personality disorder, OCD Prior inpatient treatment: 2020 admission, 2017 admission, 2016 admission.  Current/prior outpatient treatment: lamictal 50, prozac 20, propranolol 20 mg bid Psychotherapy hx: not like talking.  History of suicide: multiple since age 40 History of homicide: denies Psychiatric medication history:tegretol, prozac, cymbalta, depakote Psychiatric medication compliance history: fair Neuromodulation history: denies Current Psychiatrist: Dr. Mercy Riding Current therapist: denies  Past Medical History:  Past Medical History:  Diagnosis Date   Anxiety    Bipolar disorder (HCC)    not fully diagnosed   Bronchitis    Depression    Drug abuse (HCC)    meth, cocaine,molly, marijuana   Headache    migraines   Missed abortion 06/17/2017   Plica syndrome of left knee 01/2014   Pneumonia    as a child   Tachycardia     Past Surgical History:  Procedure Laterality Date   DILATION AND EVACUATION N/A 06/19/2017   Procedure: DILATATION AND EVACUATION;  Surgeon: Sherian Rein, MD;  Location: [facility];  Service: Gynecology;  Laterality: N/A;   KNEE ARTHROSCOPY Left 01/23/2014   Procedure: LEFT KNEE SCOPE WITH PLICA EXCISION;  Surgeon: Thera Flake., MD;  Location: [facility];  Service: Orthopedics;  Laterality: Left;   WISDOM TOOTH EXTRACTION  01/10/2014   Family History:  Family History  Problem Relation Age of Onset   Other Maternal Grandmother        cerebral amyloiod angiopathy   Breast cancer Maternal Grandmother    Hypertension Maternal Grandfather     Social History:  Social History   Substance and Sexual Activity  Alcohol Use Not Currently   Comment: occas     Social History   Substance and Sexual Activity  Drug Use Yes   Types: Benzodiazepines, Cocaine, Amphetamines, Marijuana, Heroin, Fentanyl   Comment: currently using     Social History   Socioeconomic History   Marital status: Single    Spouse name: Not on file   Number of children: 0   Years of education: 14   Highest education level: Not on file  Occupational History   Occupation: [employer]  Tobacco Use   Smoking status: Some Days    Packs/day: 0.50    Years: 4.00    Total pack years: 2.00    Types: Cigarettes   Smokeless tobacco: Former  Scientific laboratory technician Use: Never used  Substance and Sexual Activity   Alcohol use: Not Currently    Comment: occas   Drug use: Yes    Types: Benzodiazepines, Cocaine, Amphetamines, Marijuana, Heroin, Fentanyl    Comment: currently using   Sexual activity: Not Currently    Comment: offered condoms  Other Topics Concern   Not on file  Social History Narrative   Lives with aunt   Caffeine use: daily   Right handed    Social Determinants of Health   Financial Resource Strain: Not on file  Food Insecurity: Food Insecurity Present (01/05/2022)   Hunger Vital Sign    Worried About Running Out of Food in the Last Year: Often true    Ran Out of Food in the Last Year: Often true  Transportation Needs: Unmet Transportation Needs (01/05/2022)   PRAPARE - Hydrologist (Medical): Yes    Lack of Transportation (Non-Medical): Yes  Physical Activity: Not on file  Stress: Not on file  Social Connections: Not on file   Additional Social History:                         Current Medications: Current Facility-Administered Medications  Medication Dose Route Frequency Provider Last Rate Last Admin   acetaminophen (TYLENOL) tablet 650 mg  650 mg Oral Q6H PRN White, Patrice L, NP       alum & mag hydroxide-simeth (MAALOX/MYLANTA) 200-200-20 MG/5ML suspension 30 mL  30 mL Oral Q4H PRN White, Patrice L, NP       dicyclomine (BENTYL) tablet 20 mg  20 mg Oral Q6H PRN White, Patrice L, NP       feeding supplement (ENSURE ENLIVE / ENSURE PLUS) liquid 237 mL  237 mL  Oral BID BM France Ravens, MD   237 mL at 01/09/22 1615   fluconazole (DIFLUCAN) tablet 100 mg  100 mg Oral Daily France Ravens, MD       FLUoxetine (PROZAC) capsule 40 mg  40 mg Oral Daily France Ravens, MD   40 mg at 01/09/22 4259   hydrOXYzine (ATARAX) tablet 25 mg  25 mg Oral TID PRN Darrol Angel L, NP   25 mg at 01/08/22 2120   lamoTRIgine (LAMICTAL) tablet 25 mg  25 mg Oral Daily White, Patrice L, NP   25 mg at 01/09/22 0818   loperamide (IMODIUM) capsule 2-4 mg  2-4 mg Oral PRN White, Patrice L, NP       magnesium hydroxide (MILK OF MAGNESIA) suspension 30 mL  30 mL Oral Daily PRN White, Patrice L, NP   30 mL at 01/07/22 2108   methocarbamol (ROBAXIN) tablet 500 mg  500 mg Oral Q8H  PRN White, Patrice L, NP       naproxen (NAPROSYN) tablet 500 mg  500 mg Oral BID PRN White, Patrice L, NP       nitrofurantoin (macrocrystal-monohydrate) (MACROBID) capsule 100 mg  100 mg Oral Q12H White, Patrice L, NP   100 mg at 01/09/22 2100   ondansetron (ZOFRAN-ODT) disintegrating tablet 4 mg  4 mg Oral Q6H PRN White, Patrice L, NP   4 mg at 01/06/22 0852   polyethylene glycol (MIRALAX / GLYCOLAX) packet 17 g  17 g Oral Daily France Ravens, MD   17 g at 01/08/22 1242   propranolol (INDERAL) tablet 5 mg  5 mg Oral BID France Ravens, MD   5 mg at 01/09/22 1708   senna-docusate (Senokot-S) tablet 1 tablet  1 tablet Oral Daily France Ravens, MD   1 tablet at 01/09/22 6578    Lab Results:  No results found for this or any previous visit (from the past 24 hour(s)).  Blood Alcohol level:  Lab Results  Component Value Date   ETH <10 01/04/2022   ETH 71 (H) 46/96/2952    Metabolic Disorder Labs: Lab Results  Component Value Date   HGBA1C 5.2 01/04/2022   MPG 102.54 01/04/2022   MPG 93.93 04/12/2018   No results found for: "PROLACTIN" Lab Results  Component Value Date   CHOL 169 01/04/2022   TRIG 91 01/04/2022   HDL 40 (L) 01/04/2022   CHOLHDL 4.2 01/04/2022   VLDL 18 01/04/2022   LDLCALC 111 (H) 01/04/2022    LDLCALC 93 04/12/2018    Physical Findings: CIWA:  CIWA-Ar Total: 0 COWS:  COWS Total Score: 0  Musculoskeletal: Strength & Muscle Tone: within normal limits Gait & Station: normal  Psychiatric Specialty Exam: General Appearance: appears at stated age, casually dressed and groomed   Behavior: pleasant and cooperative   Psychomotor Activity: no psychomotor agitation or retardation noted   Eye Contact: good  Speech: normal amount, tone, volume and fluency    Mood: euthymic  Affect: congruent, pleasant and interactive   Thought Process: linear, goal directed, no circumstantial or tangential thought process noted, no racing thoughts or flight of ideas  Descriptions of Associations: intact  Thought Content: no bizarre content, logical and future-oriented  Hallucinations: denies AH, VH , does not appear responding to stimuli  Delusions: no paranoia, delusions of control, grandeur, ideas of reference, thought broadcasting, and magical thinking  Suicidal Thoughts: denies SI, intention, plan  Homicidal Thoughts: denies HI, intention, plan   Alertness/Orientation: alert and fully oriented   Insight: fair Judgment: fair  Memory: intact   Executive Functions  Concentration: intact  Attention Span: fair  Recall: intact  Fund of Knowledge: fair    Physical Exam  Constitutional:      Appearance: Normal appearance.  Cardiovascular:     Rate and Rhythm: Normal rate.  Pulmonary:     Effort: Pulmonary effort is normal.  Neurological:     General: No focal deficit present.     Mental Status: Alert and oriented to person, place, and time.    Review of Systems  Constitutional: Negative.  Negative for chills, fever and weight loss.  HENT: Negative.    Eyes: Negative.   Respiratory: Negative.    Cardiovascular: Negative.   Gastrointestinal:  Negative for constipation, diarrhea, nausea and vomiting.  Genitourinary: Negative.   Musculoskeletal: Negative.   Skin: Negative.    Neurological: Negative.  Negative for tingling.     Blood pressure 93/74, pulse 100, temperature  97.6 F (36.4 C), temperature source Oral, resp. rate 18, height 5\' 5"  (1.651 m), weight 87.1 kg, SpO2 99 %, unknown if currently breastfeeding. Body mass index is 31.95 kg/m.   ASSESSMENT AND PLAN MEILING HENDRIKS is a 22 year old female patient with a past psychiatric history significant for MDD, PTSD, OCD, remote history of methamphetamine use, opiate abuse, and borderline personality who presents to [facility] from [facility] behavioral health urgent care voluntary for worsening depression, suicidal ideations and 4 recent suicide attempts in past week in context of relapse on crack cocaine, xanax, muscle relaxants, percocet, and heroin. This is hospitalization day 5.  PLAN Safety and Monitoring: Voluntary admission to inpatient psychiatric unit for safety, stabilization and treatment Daily contact with patient to assess and evaluate symptoms and progress in treatment Patient's case to be discussed in multi-disciplinary team meeting Observation Level : q15 minute checks Vital signs: q12 hours Precautions: suicide, elopement, and assault   Psychiatric Problems Major depressive disorder-recurrent episode, severe without psychotic features Borderline personality disorder Obsessive-compulsive disorder PTSD Stimulant use disorder Sedative use disorder Opiate use disorder -Continue Prozac 40 mg daily for depressive symptoms -Continue lamotrigine 25 mg daily for mood lability -Continue propranolol 5 mg bid for chest palpitations and anxiety. -CIWA protocol with librium PRN -COWS protocol (last score 4)  -- The risks/benefits/side-effects/alternatives to medications were discussed in detail with the patient and time was given for questions. The patient consents to medication trial.  -- Encouraged patient to participate in unit milieu and in scheduled group therapies  Medical  Problems UTI Positive for E. coli on urine culture -Continue Macrobid for total of 5 days -Diflucan to start after 5 days of Macrobid when she has history of thrush after antibiotics    PRNs Tylenol 650 mg for mild pain Maalox/Mylanta 30 mL for indigestion Hydroxyzine 25 mg tid for anxiety Milk of Magnesia 30 mL for constipation Trazodone 50 mg for sleep   4. Discharge Planning: Social work and case management to assist with discharge planning and identification of hospital follow-up needs prior to discharge Estimated LOS: 5-7 days Discharge Concerns: Need to establish a safety plan; Medication compliance and effectiveness Discharge Goals: Return home with outpatient referrals for mental health follow-up including medication management/psychotherapy  Total Time Spent in Direct Patient Care:  I personally spent 30 minutes on the unit in direct patient care. The direct patient care time included face-to-face time with the patient, reviewing the patient's chart, communicating with other professionals, and coordinating care. Greater than 50% of this time was spent in counseling or coordinating care with the patient regarding goals of hospitalization, psycho-education, and discharge planning needs.   [facility], MD 01/10/2022, 7:18 AM

## 2022-01-10 NOTE — Progress Notes (Signed)
   01/10/22 0948  Psych Admission Type (Psych Patients Only)  Admission Status Voluntary  Psychosocial Assessment  Patient Complaints Depression;Anxiety  Eye Contact Fair  Facial Expression Anxious  Affect Depressed  Speech Logical/coherent  Interaction Assertive  Appearance/Hygiene Unremarkable  Behavior Characteristics Cooperative  Thought Process  Coherency WDL  Content WDL  Delusions None reported or observed  Perception WDL  Hallucination None reported or observed  Judgment Impaired  Confusion None  Danger to Self  Current suicidal ideation? Denies  Agreement Not to Harm Self Yes  Description of Agreement verbal  Danger to Others  Danger to Others None reported or observed

## 2022-01-10 NOTE — Plan of Care (Signed)
  Problem: Safety: Goal: Periods of time without injury will increase Outcome: Progressing   

## 2022-01-10 NOTE — BH IP Treatment Plan (Signed)
Interdisciplinary Treatment and Diagnostic Plan Update  01/10/2022 Time of Session: 1200 CHAU MCKEARNEY MRN: [id number]  Principal Diagnosis: MDD (major depressive disorder), recurrent severe, without psychosis ([place])  Secondary Diagnoses: Principal Problem:   MDD (major depressive disorder), recurrent severe, without psychosis ([place]) Active Problems:   PTSD (post-traumatic stress disorder)   Obsessive compulsive disorder   History of substance abuse ([place])   Borderline personality disorder in adult ([place])   Cocaine abuse ([place])   Heroin abuse ([place])   Benzodiazepine abuse ([place])   Current Medications:  Current Facility-Administered Medications  Medication Dose Route Frequency Provider Last Rate Last Admin   acetaminophen (TYLENOL) tablet 650 mg  650 mg Oral Q6H PRN White, Patrice L, NP       alum & mag hydroxide-simeth (MAALOX/MYLANTA) 200-200-20 MG/5ML suspension 30 mL  30 mL Oral Q4H PRN White, Patrice L, NP       dicyclomine (BENTYL) tablet 20 mg  20 mg Oral Q6H PRN White, Patrice L, NP       feeding supplement (ENSURE ENLIVE / ENSURE PLUS) liquid 237 mL  237 mL Oral BID BM France Ravens, MD   237 mL at 01/09/22 1615   fluconazole (DIFLUCAN) tablet 100 mg  100 mg Oral Daily France Ravens, MD   100 mg at 01/10/22 0804   FLUoxetine (PROZAC) capsule 40 mg  40 mg Oral Daily France Ravens, MD   40 mg at 01/10/22 [id number]   hydrOXYzine (ATARAX) tablet 25 mg  25 mg Oral TID PRN Darrol Angel L, NP   25 mg at 01/08/22 2120   lamoTRIgine (LAMICTAL) tablet 25 mg  25 mg Oral Daily White, Patrice L, NP   25 mg at 01/10/22 [id number]   loperamide (IMODIUM) capsule 2-4 mg  2-4 mg Oral PRN White, Patrice L, NP       magnesium hydroxide (MILK OF MAGNESIA) suspension 30 mL  30 mL Oral Daily PRN White, Patrice L, NP   30 mL at 01/07/22 2108   methocarbamol (ROBAXIN) tablet 500 mg  500 mg Oral Q8H PRN White, Patrice L, NP       naproxen (NAPROSYN) tablet 500 mg  500 mg Oral BID PRN White, Patrice L, NP       ondansetron  (ZOFRAN-ODT) disintegrating tablet 4 mg  4 mg Oral Q6H PRN White, Patrice L, NP   4 mg at 01/06/22 0852   polyethylene glycol (MIRALAX / GLYCOLAX) packet 17 g  17 g Oral Daily France Ravens, MD   17 g at 01/08/22 1242   propranolol (INDERAL) tablet 5 mg  5 mg Oral BID France Ravens, MD   5 mg at 01/10/22 0804   senna-docusate (Senokot-S) tablet 1 tablet  1 tablet Oral Daily France Ravens, MD   1 tablet at 01/10/22 0804   traZODone (DESYREL) tablet 50 mg  50 mg Oral QHS PRN Alesia Morin, MD       PTA Medications: Medications Prior to Admission  Medication Sig Dispense Refill Last Dose   FLUoxetine (PROZAC) 20 MG capsule Take 1 capsule (20 mg total) by mouth daily. 30 capsule 1    lamoTRIgine (LAMICTAL) 25 MG tablet Take 2 tablets (50 mg total) by mouth daily. 60 tablet 2    Multiple Vitamins-Minerals (MULTIVITAMIN WITH MINERALS) tablet Take 1 tablet by mouth daily.      propranolol (INDERAL) 20 MG tablet Take 1 tablet (20 mg total) by mouth 2 (two) times daily. 90 tablet 1    tretinoin (RETIN-A) 0.025 % cream  Apply 1 Application topically at bedtime.       Patient Stressors: Financial difficulties   Health problems   Loss of custody of her 57 month old daughter   Marital or family conflict   Substance abuse    Patient Strengths: Ability for insight  Capable of independent living  Armed forces logistics/support/administrative officer  Motivation for treatment/growth   Treatment Modalities: Medication Management, Group therapy, Case management,  1 to 1 session with clinician, Psychoeducation, Recreational therapy.   Physician Treatment Plan for Primary Diagnosis: MDD (major depressive disorder), recurrent severe, without psychosis ([place]) Long Term Goal(s):     Short Term Goals:    Medication Management: Evaluate patient's response, side effects, and tolerance of medication regimen.  Therapeutic Interventions: 1 to 1 sessions, Unit Group sessions and Medication administration.  Evaluation of Outcomes:  Progressing  Physician Treatment Plan for Secondary Diagnosis: Principal Problem:   MDD (major depressive disorder), recurrent severe, without psychosis ([place]) Active Problems:   PTSD (post-traumatic stress disorder)   Obsessive compulsive disorder   History of substance abuse ([place])   Borderline personality disorder in adult ([place])   Cocaine abuse ([place])   Heroin abuse ([place])   Benzodiazepine abuse ([place])  Long Term Goal(s):     Short Term Goals:       Medication Management: Evaluate patient's response, side effects, and tolerance of medication regimen.  Therapeutic Interventions: 1 to 1 sessions, Unit Group sessions and Medication administration.  Evaluation of Outcomes: Progressing   RN Treatment Plan for Primary Diagnosis: MDD (major depressive disorder), recurrent severe, without psychosis ([place]) Long Term Goal(s): Knowledge of disease and therapeutic regimen to maintain health will improve  Short Term Goals: Ability to remain free from injury will improve, Ability to verbalize frustration and anger appropriately will improve, Ability to demonstrate self-control, Ability to participate in decision making will improve, Ability to verbalize feelings will improve, Ability to disclose and discuss suicidal ideas, Ability to identify and develop effective coping behaviors will improve, and Compliance with prescribed medications will improve  Medication Management: RN will administer medications as ordered by provider, will assess and evaluate patient's response and provide education to patient for prescribed medication. RN will report any adverse and/or side effects to prescribing provider.  Therapeutic Interventions: 1 on 1 counseling sessions, Psychoeducation, Medication administration, Evaluate responses to treatment, Monitor vital signs and CBGs as ordered, Perform/monitor CIWA, COWS, AIMS and Fall Risk screenings as ordered, Perform wound care treatments as ordered.  Evaluation of  Outcomes: Progressing   LCSW Treatment Plan for Primary Diagnosis: MDD (major depressive disorder), recurrent severe, without psychosis ([place]) Long Term Goal(s): Safe transition to appropriate next level of care at discharge, Engage patient in therapeutic group addressing interpersonal concerns.  Short Term Goals: Engage patient in aftercare planning with referrals and resources, Increase social support, Increase ability to appropriately verbalize feelings, Increase emotional regulation, Facilitate acceptance of mental health diagnosis and concerns, Facilitate patient progression through stages of change regarding substance use diagnoses and concerns, Identify triggers associated with mental health/substance abuse issues, and Increase skills for wellness and recovery  Therapeutic Interventions: Assess for all discharge needs, 1 to 1 time with Social worker, Explore available resources and support systems, Assess for adequacy in community support network, Educate family and significant other(s) on suicide prevention, Complete Psychosocial Assessment, Interpersonal group therapy.  Evaluation of Outcomes: Progressing   Progress in Treatment: Attending groups: Yes. Participating in groups: Yes. Taking medication as prescribed: Yes. Toleration medication: Yes. Family/Significant other contact made: Yes, individual(s) contacted:  Wess Botts [phone] (Friend) Patient understands diagnosis: Yes. Discussing patient identified problems/goals with staff: Yes. Medical problems stabilized or resolved: Yes. Denies suicidal/homicidal ideation: Yes. Issues/concerns per patient self-inventory: Yes. Other:   New problem(s) identified: No, Describe:  None Reported  New Short Term/Long Term Goal(s):Medication Stabilization  Patient Goals:  Coping Skills  Discharge Plan or Barriers:   Reason for Continuation of Hospitalization: Anxiety Depression Medication stabilization Suicidal  ideation Withdrawal symptoms  Estimated Length of Stay: 3-7 Days  Last 3 [place] Suicide Severity Risk Score: Cataract Admission (Current) from 01/05/2022 in [facility] ED from 01/04/2022 in [facility] ED from 11/08/2021 in [facility]  C-SSRS RISK CATEGORY High Risk No Risk No Risk       Last PHQ 2/9 Scores:    01/04/2022   12:56 PM 09/12/2021    1:54 PM 04/30/2018   11:46 AM  Depression screen PHQ 2/9  Decreased Interest 3 2 1   Down, Depressed, Hopeless 3 3 1   PHQ - 2 Score 6 5 2   Altered sleeping 3 3 2   Tired, decreased energy 2 3 2   Change in appetite 3 2 0  Feeling bad or failure about yourself  3 3 0  Trouble concentrating 3 3 1   Moving slowly or fidgety/restless 1 0 0  Suicidal thoughts 2 1 0  PHQ-9 Score 23 20 7   Difficult doing work/chores Very difficult  Somewhat difficult   detox, medication management for mood stabilization; elimination of SI thoughts; development of comprehensive mental wellness/sobriety plan  Scribe for Treatment Team: Windle Guard, LCSW 01/10/2022 1:50 PM

## 2022-01-10 NOTE — BHH Counselor (Signed)
The Pt was visited by her DSS case worker, Retta Mac, today.  The CSW was contact by the nurse who stated that a phone number for Mrs. Spinks was left at the nurses station seeking a return phone call.  CSW spoke with the Pt who states that she would like the CSW to contact Mrs. Spinks and gives permission to share her information with Mrs.  Myrtie Neither.  CSW attempted to contact both the phone numbers provided by the Pt [phone]) and the nurse [phone]).  CSW left a HIPAA approved voicemail asking for a return call.  CSW will update the Pt's chart if a return phone call is provided from Mrs. Spinks.

## 2022-01-10 NOTE — Plan of Care (Signed)
  Problem: Education: Goal: Knowledge of [facility] Education information/materials will improve Outcome: Progressing Goal: Emotional status will improve Outcome: Progressing Goal: Mental status will improve Outcome: Progressing Goal: Verbalization of understanding the information provided will improve Outcome: Progressing   Problem: Activity: Goal: Interest or engagement in activities will improve Outcome: Progressing Goal: Sleeping patterns will improve Outcome: Progressing   Problem: Coping: Goal: Ability to verbalize frustrations and anger appropriately will improve Outcome: Progressing Goal: Ability to demonstrate self-control will improve Outcome: Progressing   Problem: Health Behavior/Discharge Planning: Goal: Identification of resources available to assist in meeting health care needs will improve Outcome: Progressing Goal: Compliance with treatment plan for underlying cause of condition will improve Outcome: Progressing   Problem: Physical Regulation: Goal: Ability to maintain clinical measurements within normal limits will improve Outcome: Progressing   Problem: Safety: Goal: Periods of time without injury will increase Outcome: Progressing   Problem: Education: Goal: Ability to make informed decisions regarding treatment will improve Outcome: Progressing   Problem: Coping: Goal: Coping ability will improve Outcome: Progressing   Problem: Health Behavior/Discharge Planning: Goal: Identification of resources available to assist in meeting health care needs will improve Outcome: Progressing   Problem: Medication: Goal: Compliance with prescribed medication regimen will improve Outcome: Progressing   Problem: Self-Concept: Goal: Ability to disclose and discuss suicidal ideas will improve Outcome: Progressing Goal: Will verbalize positive feelings about self Outcome: Progressing   

## 2022-01-10 NOTE — Group Note (Signed)
Date:  01/10/2022 Time:  4:28 PM  Group Topic/Focus:  Dimensions of Wellness:   The focus of this group is to introduce the topic of wellness and discuss the role each dimension of wellness plays in total health.    Participation Level:  Active  Participation Quality:  Appropriate  Affect:  Appropriate  Cognitive:  Appropriate  Insight: Appropriate  Engagement in Group:  Engaged  Modes of Intervention:  Exploration  Additional Comments:     Jerrye Beavers 01/10/2022, 4:28 PM

## 2022-01-10 NOTE — Group Note (Signed)
Date:  01/10/2022 Time:  2:38 PM  Group Topic/Focus:  Orientation:   The focus of this group is to educate the patient on the purpose and policies of crisis stabilization and provide a format to answer questions about their admission.  The group details unit policies and expectations of patients while admitted.    Participation Level:  Active  Participation Quality:  Appropriate  Affect:  Appropriate  Cognitive:  Appropriate  Insight: Appropriate  Engagement in Group:  Engaged  Modes of Intervention:  Discussion  Additional Comments:     Jerrye Beavers 01/10/2022, 2:38 PM

## 2022-01-10 NOTE — Progress Notes (Signed)
   01/10/22 2045  Psych Admission Type (Psych Patients Only)  Admission Status Voluntary  Psychosocial Assessment  Patient Complaints Anxiety;Depression;Sleep disturbance  Eye Contact Fair  Facial Expression Anxious  Affect Depressed  Speech Logical/coherent  Interaction Assertive  Motor Activity Slow  Appearance/Hygiene Unremarkable  Behavior Characteristics Cooperative  Mood Depressed  Aggressive Behavior  Effect No apparent injury  Thought Process  Coherency WDL  Content WDL  Delusions WDL  Perception WDL  Hallucination None reported or observed  Judgment WDL  Confusion WDL  Danger to Self  Current suicidal ideation? Denies  Danger to Others  Danger to Others None reported or observed

## 2022-01-11 DIAGNOSIS — F332 Major depressive disorder, recurrent severe without psychotic features: Secondary | ICD-10-CM | POA: Diagnosis not present

## 2022-01-11 MED ORDER — LAMOTRIGINE 25 MG PO TABS: 25.0000 mg | ORAL_TABLET | Freq: Every day | ORAL | 0 refills | Status: DC

## 2022-01-11 MED ORDER — PROPRANOLOL HCL 10 MG PO TABS: 5.0000 mg | ORAL_TABLET | Freq: Two times a day (BID) | ORAL | 0 refills | Status: DC

## 2022-01-11 MED ORDER — TRAZODONE HCL 50 MG PO TABS: 50.0000 mg | ORAL_TABLET | Freq: Every evening | ORAL | 0 refills | Status: DC | PRN

## 2022-01-11 MED ORDER — HYDROXYZINE HCL 25 MG PO TABS: 25.0000 mg | ORAL_TABLET | Freq: Three times a day (TID) | ORAL | 0 refills | Status: DC | PRN

## 2022-01-11 MED ORDER — FLUOXETINE HCL 40 MG PO CAPS: 40.0000 mg | ORAL_CAPSULE | Freq: Every day | ORAL | 3 refills | Status: DC

## 2022-01-11 NOTE — Progress Notes (Signed)
Patient is discharging at this time. Patient is A&Ox4. Vs stable. Patient denies SI,HI, and A/V/H with no plan/intent. Printed AVS reviewed with and given to patient along with medications and follow up appointments. Patient verbalized all understanding. All valuables/belongings returned to patient. Patient denies any pain/discomfort. No s/s of current distress.   

## 2022-01-11 NOTE — BHH Suicide Risk Assessment (Addendum)
Suicide Risk Assessment  Discharge Assessment    [facility] Discharge Suicide Risk Assessment   Principal Problem: MDD (major depressive disorder), recurrent severe, without psychosis (HCC) Discharge Diagnoses: Principal Problem:   MDD (major depressive disorder), recurrent severe, without psychosis (HCC) Active Problems:   PTSD (post-traumatic stress disorder)   Obsessive compulsive disorder   History of substance abuse (HCC)   Borderline personality disorder in adult (HCC)   Cocaine abuse (HCC)   Heroin abuse (HCC)   Benzodiazepine abuse (HCC)  HPI: Jenny Clark is a 22 year old female patient with a past psychiatric history significant for MDD, PTSD, OCD, remote history of methamphetamine use, opiate abuse, and borderline personality who presents to [facility] from [facility] behavioral health urgent care voluntary for worsening depression, suicidal ideations and 4 recent suicide attempts in past week in context of relapse on crack cocaine, xanax, muscle relaxants, percocet, and heroin.   24 hr chart review: Patient visible within milieu and unit attending activities and group. Interacting with peers and staff appropriately. Patient received  prn Trazodone 50 mg, Hydroxyzine 25 mg x1 for insomnia. Slept 8.5 hours.   Assessment: Patient observed in her room. Casually dressed. Euthymic mood, congruent affect. Consistent eye contact. Rates anxiety 0/10, depression 1/10; 'I feel really good today. I've had a nice few days'. She reports sleeping and eating 'normally'. Observed in milieu attending unit activities and groups. Medications compliant; denies adverse effects or reactions. Reports being on same regimen from past. She reports plan to go to her friend's house after discharge; is aware of CPS case, states plan to 'remain sober. They will give me drug tests and I will pass them so I will be fine'. Expressed plan to go to NA meetings; states person who lead meeting on unit gave information that  she plans on following up with.  Denies SI, HI, AVH. Contracts for safety.   Collateral: Alcario Drought (friend) [phone] Confirmed plan for patient to return to her home upon discharge, confirmed transportation as well. Denies any access to weapons or safety concerns. Plan to come around lunch time.   Objective:  Chart Review The patient's chart was reviewed and nursing notes were reviewed. The patient's case was discussed in multidisciplinary team meeting. Per [facility], patient was taking medications appropriately. Per nursing, patient is calm and cooperative and attended 1 group sessions. The following as needed medications were given: none Total Time spent with patient: 30 minutes  Musculoskeletal: Strength & Muscle Tone: within normal limits Gait & Station: normal Patient leans: N/A  Psychiatric Specialty Exam  Presentation  General Appearance:  Appropriate for Environment; Casual  Eye Contact: Fair  Speech: Clear and Coherent  Speech Volume: Normal  Handedness: Right   Mood and Affect  Mood: Euthymic  Duration of Depression Symptoms: Greater than two weeks  Affect: Appropriate; Full Range   Thought Process  Thought Processes: Coherent  Descriptions of Associations:Intact  Orientation:Full (Time, Place and Person)  Thought Content:Logical  History of Schizophrenia/Schizoaffective disorder:No  Duration of Psychotic Symptoms:No data recorded Hallucinations:No data recorded Ideas of Reference:None  Suicidal Thoughts:No data recorded Homicidal Thoughts:No data recorded  Sensorium  Memory: Immediate Good; Recent Good; Remote Good  Judgment: Fair  Insight: Fair   Chartered certified accountant: Fair  Attention Span: Fair  Recall: Fair  Fund of Knowledge: Fair  Language: Good   Psychomotor Activity  Psychomotor Activity:No data recorded  Assets  Assets: Communication Skills; Desire for Improvement; Social Support; Physical  Health   Sleep  Sleep:No data recorded  Physical Exam: Physical Exam Vitals and nursing note reviewed.  Constitutional:      General: She is not in acute distress.    Appearance: She is not toxic-appearing.  HENT:     Head: Normocephalic.     Nose: Nose normal.     Mouth/Throat:     Mouth: Mucous membranes are moist.     Pharynx: Oropharynx is clear.  Eyes:     Pupils: Pupils are equal, round, and reactive to light.  Cardiovascular:     Rate and Rhythm: Tachycardia present.     Pulses: Normal pulses.  Pulmonary:     Effort: Pulmonary effort is normal.  Abdominal:     Palpations: Abdomen is soft.  Musculoskeletal:        General: Normal range of motion.     Cervical back: Normal range of motion.  Skin:    General: Skin is warm and dry.  Neurological:     Mental Status: She is alert and oriented to person, place, and time.  Psychiatric:        Attention and Perception: Attention and perception normal.        Mood and Affect: Mood and affect normal.        Speech: Speech normal.        Behavior: Behavior is cooperative.        Thought Content: Thought content is not paranoid or delusional. Thought content does not include homicidal or suicidal ideation. Thought content does not include homicidal or suicidal plan.        Cognition and Memory: Cognition and memory normal.        Judgment: Judgment normal.    Review of Systems  Psychiatric/Behavioral:  Positive for substance abuse. Negative for depression and suicidal ideas.   All other systems reviewed and are negative.  Blood pressure 113/72, pulse 97, temperature 97.7 F (36.5 C), temperature source Oral, resp. rate 16, height 5\' 5"  (1.651 m), weight 87.1 kg, SpO2 98 %, unknown if currently breastfeeding. Body mass index is 31.95 kg/m.  Mental Status Per Nursing Assessment::   On Admission:  Suicidal ideation indicated by patient  Demographic Factors:  Adolescent or young adult and Caucasian  Loss Factors: Loss  of significant relationship  Historical Factors: Prior suicide attempts, Family history of mental illness or substance abuse, and Impulsivity  Risk Reduction Factors:   Responsible for children under 62 years of age, Sense of responsibility to family, Living with another person, especially a relative, and Positive social support  Continued Clinical Symptoms:  Bipolar Disorder:   Mixed State Alcohol/Substance Abuse/Dependencies More than one psychiatric diagnosis  Cognitive Features That Contribute To Risk:  None    Suicide Risk:  Moderate:  Frequent suicidal ideation with limited intensity, and duration, some specificity in terms of plans, no associated intent, good self-control, limited dysphoria/symptomatology, some risk factors present, and identifiable protective factors, including available and accessible social support.   Follow-up Information     [facility]. Call on 02/11/2022.   Specialty: Behavioral Health Why: You have an appointment on 02/11/22 at 8:30 am for medication management services.  Please call to confirm details. Contact information: [address] [place] [phone]                Plan Of Care/Follow-up recommendations:  PLAN Safety and Monitoring: Voluntary admission to inpatient psychiatric unit for safety, stabilization and treatment Daily contact with patient to assess and evaluate symptoms and progress  in treatment Patient's case to be discussed in multi-disciplinary team meeting Observation Level : q15 minute checks Vital signs: q12 hours Precautions: suicide, elopement, and assault   Psychiatric Problems Major depressive disorder-recurrent episode, severe without psychotic features Borderline personality disorder Obsessive-compulsive disorder PTSD Stimulant use disorder Sedative use disorder Opiate use disorder -Continue Prozac 40 mg daily for depressive  symptoms -Continue lamotrigine 25 mg daily for mood lability -Continue propranolol 5 mg bid for chest palpitations and anxiety. -CIWA protocol with librium PRN -COWS protocol (last score 4)   -- The risks/benefits/side-effects/alternatives to medications were discussed in detail with the patient and time was given for questions. The patient consents to medication trial.  -- Encouraged patient to participate in unit milieu and in scheduled group therapies   Medical Problems UTI Positive for E. coli on urine culture -Continue Macrobid for total of 5 days -Diflucan to start after 5 days of Macrobid when she has history of thrush after antibiotics   PRNs Tylenol 650 mg for mild pain Maalox/Mylanta 30 mL for indigestion Hydroxyzine 25 mg tid for anxiety Milk of Magnesia 30 mL for constipation Trazodone 50 mg for sleep   4. Discharge Planning: Social work and case management to assist with discharge planning and identification of hospital follow-up needs prior to discharge Estimated LOS: 5-7 days Discharge Concerns: Need to establish a safety plan; Medication compliance and effectiveness Discharge Goals: Return home with outpatient referrals for mental health follow-up including medication management/psychotherapy   Activity: as tolerated  Diet: heart healthy  Other: -Follow-up with your outpatient psychiatric provider -instructions on appointment date, time, and address (location) are provided to you in discharge paperwork.  -Take your psychiatric medications as prescribed at discharge - instructions are provided to you in the discharge paperwork  -Follow-up with outpatient primary care doctor and other specialists -for management of preventative medicine and chronic medical disease, including: polysubstance abuse, UTI+ e-coli  -Testing: Follow-up with outpatient provider for abnormal lab results: UDS+ cocaine, oxazepam  -Recommend abstinence from alcohol, tobacco, and other  illicit drug use at discharge.   -If your psychiatric symptoms recur, worsen, or if you have side effects to your psychiatric medications, call your outpatient psychiatric provider, [phone] or go to the nearest emergency department.  -If suicidal thoughts recur, call your outpatient psychiatric provider, 911, [phone] or go to the nearest emergency department.  Inda Merlin, NP 01/11/2022, 9:31 AM

## 2022-01-11 NOTE — Progress Notes (Signed)
  [facility] Adult Case Management Discharge Plan :  Will you be returning to the same living situation after discharge:  Yes,  friend's mother At discharge, do you have transportation home?: Yes,  friend Do you have the ability to pay for your medications: Yes,  insurance  Release of information consent forms completed and emailed to Medical Records, then turned in to Medical Records by CSW.   Patient to Follow up at:  Follow-up Information     [facility]. Call on 02/11/2022.   Specialty: Behavioral Health Why: You have an appointment on 02/11/22 at 8:30 am for medication management services.  Please call to confirm details. Contact information: [place] [phone]                Next level of care provider has access to [place] and Suicide Prevention discussed: Yes,  with friend     Has patient been referred to the Quitline?: Patient refused referral  Patient has been referred for addiction treatment: Pt. refused referral  Maretta Los, LCSW 01/11/2022, 10:08 AM

## 2022-01-11 NOTE — Group Note (Signed)
[facility] LCSW Group Therapy Note   Date/Time:  01/11/2022 11:15AM-12:00PM   Type of Therapy and Topic:  Group Therapy:  Feelings About Hospitalization   Participation Level:  Did Not Attend    Description of Group This process group involved patients discussing their feelings related to being hospitalized, as well as the benefits they see to being in the hospital.  These feelings and benefits were itemized.  The group then brainstormed specific ways in which they could seek those same benefits when they discharge and return home.   Therapeutic Goals Patient will identify and describe positive and negative feelings related to hospitalization Patient will verbalize benefits of hospitalization to themselves personally Patients will brainstorm together ways they can obtain similar benefits in the outpatient setting, identify barriers to wellness and possible solutions   Summary of Patient Progress:  N/A   Therapeutic Modalities Cognitive Behavioral Therapy Motivational Interviewing       Kallen Delatorre Grossman-Orr, LCSW 01/11/2022, 11:52 AM  
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
## 2022-01-11 NOTE — Progress Notes (Signed)
   01/11/22 0557  15 Minute Checks  Location Bedroom  Visual Appearance Calm  Behavior Sleeping  Sleep (Behavioral Health Patients Only)  Calculate sleep? (Click Yes once per 24 hr at 0600 safety check) Yes  Documented sleep last 24 hours 8.5

## 2022-01-30 ENCOUNTER — Emergency Department (HOSPITAL_COMMUNITY): Payer: Managed Care, Other (non HMO)

## 2022-01-30 ENCOUNTER — Other Ambulatory Visit: Payer: Self-pay

## 2022-01-30 ENCOUNTER — Emergency Department (HOSPITAL_COMMUNITY)
Admission: EM | Admit: 2022-01-30 | Discharge: 2022-01-31 | Disposition: A | Discharge status: Home / Self Care | Payer: Managed Care, Other (non HMO) | Attending: Student | Admitting: Student

## 2022-01-30 DIAGNOSIS — S99912A Unspecified injury of left ankle, initial encounter: Secondary | ICD-10-CM | POA: Diagnosis present

## 2022-01-30 DIAGNOSIS — Z79899 Other long term (current) drug therapy: Secondary | ICD-10-CM | POA: Insufficient documentation

## 2022-01-30 DIAGNOSIS — M25551 Pain in right hip: Secondary | ICD-10-CM | POA: Diagnosis not present

## 2022-01-30 DIAGNOSIS — S00512A Abrasion of oral cavity, initial encounter: Secondary | ICD-10-CM | POA: Diagnosis not present

## 2022-01-30 DIAGNOSIS — M79632 Pain in left forearm: Secondary | ICD-10-CM | POA: Diagnosis not present

## 2022-01-30 DIAGNOSIS — R930 Abnormal findings on diagnostic imaging of skull and head, not elsewhere classified: Secondary | ICD-10-CM | POA: Diagnosis not present

## 2022-01-30 DIAGNOSIS — S93402A Sprain of unspecified ligament of left ankle, initial encounter: Secondary | ICD-10-CM | POA: Insufficient documentation

## 2022-01-30 DIAGNOSIS — M25561 Pain in right knee: Secondary | ICD-10-CM | POA: Diagnosis not present

## 2022-01-30 DIAGNOSIS — R6884 Jaw pain: Secondary | ICD-10-CM | POA: Insufficient documentation

## 2022-01-30 LAB — RAPID URINE DRUG SCREEN, HOSP PERFORMED
Amphetamines: NOT DETECTED
Barbiturates: NOT DETECTED
Benzodiazepines: NOT DETECTED
Cocaine: POSITIVE — AB
Opiates: POSITIVE — AB
Tetrahydrocannabinol: NOT DETECTED

## 2022-01-30 LAB — PREGNANCY, URINE: Preg Test, Ur: NEGATIVE

## 2022-01-30 MED ORDER — ACETAMINOPHEN 500 MG PO TABS
1000.0000 mg | ORAL_TABLET | Freq: Four times a day (QID) | ORAL | Status: DC | PRN
  Administered 2022-01-30: 1000 mg via ORAL
  Filled 2022-01-30: qty 2
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
## 2022-01-30 NOTE — ED Notes (Signed)
[facility]/[facility] speaking with patient now
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
## 2022-01-31 ENCOUNTER — Emergency Department (HOSPITAL_COMMUNITY): Payer: Managed Care, Other (non HMO)

## 2022-01-31 DIAGNOSIS — S93402A Sprain of unspecified ligament of left ankle, initial encounter: Secondary | ICD-10-CM | POA: Diagnosis not present

## 2022-01-31 MED ORDER — METHOCARBAMOL 500 MG PO TABS: 500.0000 mg | ORAL_TABLET | Freq: Two times a day (BID) | ORAL | 0 refills | Status: DC

## 2022-01-31 MED ORDER — METHOCARBAMOL 500 MG PO TABS
750.0000 mg | ORAL_TABLET | Freq: Once | ORAL | Status: AC
  Administered 2022-01-31: 750 mg via ORAL
  Filled 2022-01-31: qty 2

## 2022-01-31 NOTE — Discharge Instructions (Addendum)

## 2022-01-31 NOTE — Progress Notes (Signed)
Orthopedic Tech Progress Note Patient Details:  Jenny Clark April 23, 2000 [id number]  Ortho Devices Type of Ortho Device: ASO Ortho Device/Splint Location: lle Ortho Device/Splint Interventions: Ordered, Application, Adjustment  Patient did not want to awake to get instruction on brace. I taught the patients family member in the room. Post Interventions Patient Tolerated: Well Instructions Provided: Care of device, Adjustment of device  Karolee Stamps 01/31/2022, 6:04 AM
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
## 2022-02-11 ENCOUNTER — Telehealth (HOSPITAL_BASED_OUTPATIENT_CLINIC_OR_DEPARTMENT_OTHER): Payer: Medicaid Other | Admitting: Psychiatry

## 2022-02-11 ENCOUNTER — Encounter (HOSPITAL_COMMUNITY): Payer: Self-pay | Admitting: Psychiatry

## 2022-02-11 DIAGNOSIS — F603 Borderline personality disorder: Secondary | ICD-10-CM | POA: Diagnosis not present

## 2022-02-11 DIAGNOSIS — F4310 Post-traumatic stress disorder, unspecified: Secondary | ICD-10-CM

## 2022-02-11 DIAGNOSIS — F330 Major depressive disorder, recurrent, mild: Secondary | ICD-10-CM

## 2022-02-11 DIAGNOSIS — F1910 Other psychoactive substance abuse, uncomplicated: Secondary | ICD-10-CM

## 2022-02-11 MED ORDER — LAMOTRIGINE 25 MG PO TABS: ORAL_TABLET | ORAL | 0 refills | Status: DC

## 2022-02-11 MED ORDER — HYDROXYZINE HCL 25 MG PO TABS: 25.0000 mg | ORAL_TABLET | Freq: Three times a day (TID) | ORAL | 0 refills | Status: DC | PRN

## 2022-02-11 MED ORDER — FLUOXETINE HCL 20 MG PO CAPS: ORAL_CAPSULE | ORAL | 0 refills | Status: DC

## 2022-02-11 MED ORDER — PROPRANOLOL HCL 10 MG PO TABS: 5.0000 mg | ORAL_TABLET | Freq: Two times a day (BID) | ORAL | 0 refills | Status: DC
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
## 2022-02-14 ENCOUNTER — Telehealth: Payer: Self-pay

## 2022-02-14 NOTE — Telephone Encounter (Signed)
Called twice phone rang than said "your call can not be completed as dialed."

## 2022-02-17 ENCOUNTER — Ambulatory Visit: Payer: Medicaid Other | Attending: Cardiology | Admitting: Cardiology

## 2022-03-06 ENCOUNTER — Other Ambulatory Visit (HOSPITAL_COMMUNITY): Payer: Self-pay | Admitting: Psychiatry

## 2022-03-06 DIAGNOSIS — F4310 Post-traumatic stress disorder, unspecified: Secondary | ICD-10-CM

## 2022-03-06 DIAGNOSIS — F603 Borderline personality disorder: Secondary | ICD-10-CM

## 2022-03-06 DIAGNOSIS — F330 Major depressive disorder, recurrent, mild: Secondary | ICD-10-CM

## 2022-03-19 ENCOUNTER — Ambulatory Visit (HOSPITAL_COMMUNITY): Payer: 59 | Admitting: Psychiatry

## 2022-03-19 ENCOUNTER — Other Ambulatory Visit (HOSPITAL_COMMUNITY): Payer: Self-pay | Admitting: Psychiatry

## 2022-03-19 DIAGNOSIS — F330 Major depressive disorder, recurrent, mild: Secondary | ICD-10-CM

## 2022-03-19 DIAGNOSIS — F603 Borderline personality disorder: Secondary | ICD-10-CM

## 2022-03-19 DIAGNOSIS — F4310 Post-traumatic stress disorder, unspecified: Secondary | ICD-10-CM

## 2022-03-20 NOTE — Telephone Encounter (Signed)
Patient missed appointment on 03/19/22. Will send in a 30 day refill, however she will need to schedule another appointment for further refills

## 2022-03-27 ENCOUNTER — Emergency Department (HOSPITAL_COMMUNITY)
Admission: EM | Admit: 2022-03-27 | Discharge: 2022-03-27 | Disposition: A | Discharge status: Home / Self Care | Payer: Managed Care, Other (non HMO) | Attending: Emergency Medicine | Admitting: Emergency Medicine

## 2022-03-27 ENCOUNTER — Other Ambulatory Visit: Payer: Self-pay

## 2022-03-27 DIAGNOSIS — T401X1A Poisoning by heroin, accidental (unintentional), initial encounter: Secondary | ICD-10-CM | POA: Insufficient documentation

## 2022-03-27 DIAGNOSIS — E876 Hypokalemia: Secondary | ICD-10-CM | POA: Insufficient documentation

## 2022-03-27 DIAGNOSIS — R400 Somnolence: Secondary | ICD-10-CM | POA: Diagnosis not present

## 2022-03-27 LAB — CBC WITH DIFFERENTIAL/PLATELET
Abs Immature Granulocytes: 0.01 10*3/uL (ref 0.00–0.07)
Basophils Absolute: 0.1 10*3/uL (ref 0.0–0.1)
Basophils Relative: 1 %
Eosinophils Absolute: 0.2 10*3/uL (ref 0.0–0.5)
Eosinophils Relative: 4 %
HCT: 32.5 % — ABNORMAL LOW (ref 36.0–46.0)
Hemoglobin: 10.3 g/dL — ABNORMAL LOW (ref 12.0–15.0)
Immature Granulocytes: 0 %
Lymphocytes Relative: 47 %
Lymphs Abs: 2.8 10*3/uL (ref 0.7–4.0)
MCH: 27.6 pg (ref 26.0–34.0)
MCHC: 31.7 g/dL (ref 30.0–36.0)
MCV: 87.1 fL (ref 80.0–100.0)
Monocytes Absolute: 0.5 10*3/uL (ref 0.1–1.0)
Monocytes Relative: 9 %
Neutro Abs: 2.3 10*3/uL (ref 1.7–7.7)
Neutrophils Relative %: 39 %
Platelets: 203 10*3/uL (ref 150–400)
RBC: 3.73 MIL/uL — ABNORMAL LOW (ref 3.87–5.11)
RDW: 15 % (ref 11.5–15.5)
WBC: 5.8 10*3/uL (ref 4.0–10.5)
nRBC: 0 % (ref 0.0–0.2)

## 2022-03-27 LAB — COMPREHENSIVE METABOLIC PANEL
ALT: 17 U/L (ref 0–44)
AST: 22 U/L (ref 15–41)
Albumin: 3 g/dL — ABNORMAL LOW (ref 3.5–5.0)
Alkaline Phosphatase: 61 U/L (ref 38–126)
Anion gap: 4 — ABNORMAL LOW (ref 5–15)
BUN: 8 mg/dL (ref 6–20)
CO2: 25 mmol/L (ref 22–32)
Calcium: 8.2 mg/dL — ABNORMAL LOW (ref 8.9–10.3)
Chloride: 108 mmol/L (ref 98–111)
Creatinine, Ser: 0.6 mg/dL (ref 0.44–1.00)
GFR, Estimated: >60 mL/min (ref >60)
Glucose, Bld: 96 mg/dL (ref 70–99)
Potassium: 2.9 mmol/L — ABNORMAL LOW (ref 3.5–5.1)
Sodium: 137 mmol/L (ref 135–145)
Total Bilirubin: 0.3 mg/dL (ref 0.3–1.2)
Total Protein: 5.7 g/dL — ABNORMAL LOW (ref 6.5–8.1)

## 2022-03-27 LAB — ETHANOL: Alcohol, Ethyl (B): <10 mg/dL (ref <10)

## 2022-03-27 LAB — HCG, QUANTITATIVE, PREGNANCY: hCG, Beta Chain, Quant, S: <1 m[IU]/mL (ref <5)

## 2022-03-27 LAB — SALICYLATE LEVEL: Salicylate Lvl: <7 mg/dL — ABNORMAL LOW (ref 7.0–30.0)

## 2022-03-27 LAB — ACETAMINOPHEN LEVEL: Acetaminophen (Tylenol), Serum: <10 ug/mL — ABNORMAL LOW (ref 10–30)

## 2022-03-27 MED ORDER — POTASSIUM CHLORIDE CRYS ER 20 MEQ PO TBCR
40.0000 meq | EXTENDED_RELEASE_TABLET | Freq: Once | ORAL | Status: AC
  Administered 2022-03-27: 40 meq via ORAL
  Filled 2022-03-27: qty 2

## 2022-03-27 MED ORDER — NALOXONE HCL 4 MG/0.1ML NA LIQD
1.0000 | Freq: Once | NASAL | Status: AC
  Administered 2022-03-27: 1 via NASAL
  Filled 2022-03-27: qty 4

## 2022-03-27 MED ORDER — MAGNESIUM SULFATE 2 GM/50ML IV SOLN
2.0000 g | Freq: Once | INTRAVENOUS | Status: DC
  Administered 2022-03-27: 2 g via INTRAVENOUS
  Filled 2022-03-27: qty 50

## 2022-03-27 MED ORDER — POTASSIUM CHLORIDE 10 MEQ/100ML IV SOLN: 10.0000 meq | Freq: Once | INTRAVENOUS | Status: DC

## 2022-03-27 NOTE — ED Provider Notes (Signed)
[facility] Provider Note   CSN: [id number] Arrival date & time: 03/27/22  [id number]     History  Chief Complaint  Patient presents with   Drug Overdose    EVANGELYN FRANTZEN is a 22 y.o. female.  ELROSE COLACINO is a 22 y.o. female with a history of substance abuse, bipolar disorder, who presents to the emergency department via EMS after heroin overdose.  Patient was found at hotel after using heroin.  Patient was alert on scene but became drowsier en route and was given 4 mg of intranasal Narcan with improvement.  On arrival patient is drowsy but can wake briefly to answer questions.  Reports injecting heroin into her left arm, reports she was trying to get high but was not trying to harm herself.  Reports using some alcohol last night but denies other substance use.  Denies any pain or other complaints at this time.  Patient's significant other arrived at bedside and reports that he gave her a dose of Narcan before EMS arrived as well.  He reports that they are both trying to get clean.  The history is provided by the patient and medical records.  Drug Overdose       Home Medications Prior to Admission medications   Medication Sig Start Date End Date Taking? Authorizing Provider  FLUoxetine (PROZAC) 40 MG capsule TAKE 1 CAPSULE (40 MG TOTAL) BY MOUTH DAILY. 03/20/22   Vista Mink, MD  hydrOXYzine (ATARAX) 25 MG tablet Take 1 tablet (25 mg total) by mouth 3 (three) times daily as needed for anxiety. 02/11/22   Vista Mink, MD  lamoTRIgine (LAMICTAL) 25 MG tablet Take 1 tablet (25 mg total) by mouth daily for 14 days, THEN 2 tablets (50 mg total) daily for 20 days. 02/11/22 03/17/22  Vista Mink, MD  methocarbamol (ROBAXIN) 500 MG tablet Take 1 tablet (500 mg total) by mouth 2 (two) times daily. 01/31/22   Prosperi, Christian H, PA-C  Multiple Vitamins-Minerals (MULTIVITAMIN WITH MINERALS) tablet Take 1 tablet by mouth daily.    [provider]  propranolol (INDERAL) 10 MG tablet Take 0.5 tablets (5 mg total) by mouth 2 (two) times daily. 02/11/22   Vista Mink, MD  tretinoin (RETIN-A) 0.025 % cream Apply 1 Application topically at bedtime.    [provider]      Allergies    Patient has no known allergies.    Review of Systems   Review of Systems  Constitutional:  Negative for chills and fever.  Gastrointestinal:  Negative for anal bleeding and vomiting.  Psychiatric/Behavioral:  Negative for dysphoric mood and suicidal ideas.     Physical Exam Updated Vital Signs BP (!) 94/54   Pulse 62   Temp 98.4 F (36.9 C)   Resp 10   Ht 5\' 5"  (1.651 m)   Wt 81.6 kg   SpO2 99%   BMI 29.95 kg/m  Physical Exam Vitals and nursing note reviewed.  Constitutional:      General: She is not in acute distress.    Appearance: Normal appearance. She is well-developed. She is not diaphoretic.     Comments: Somnolent but arouses to verbal stimuli  HENT:     Head: Normocephalic and atraumatic.     Mouth/Throat:     Mouth: Mucous membranes are moist.     Pharynx: Oropharynx is clear.  Eyes:     General:        Right  eye: No discharge.        Left eye: No discharge.     Comments: Bilaterally pupils 3 mm, reactive to light  Cardiovascular:     Rate and Rhythm: Normal rate and regular rhythm.     Pulses: Normal pulses.     Heart sounds: Normal heart sounds.  Pulmonary:     Effort: Pulmonary effort is normal. No respiratory distress.     Breath sounds: Normal breath sounds. No wheezing or rales.     Comments: Respirations equal and unlabored, patient able to speak in full sentences, lungs clear to auscultation bilaterally  Abdominal:     General: Bowel sounds are normal. There is no distension.     Palpations: Abdomen is soft. There is no mass.     Tenderness: There is no abdominal tenderness. There is no guarding.     Comments: Abdomen soft, nondistended, nontender to palpation in all quadrants without  guarding or peritoneal signs  Musculoskeletal:        General: No deformity.     Cervical back: Neck supple.  Skin:    General: Skin is warm and dry.     Capillary Refill: Capillary refill takes less than 2 seconds.  Neurological:     Mental Status: She is oriented to person, place, and time.     Coordination: Coordination normal.     Comments: Speech is clear, able to follow commands CN III-XII intact Normal strength in upper and lower extremities bilaterally including dorsiflexion and plantar flexion, strong and equal grip strength Sensation normal to light and sharp touch Moves extremities without ataxia, coordination intact  Psychiatric:        Mood and Affect: Mood normal.        Behavior: Behavior normal.        Thought Content: Thought content does not include homicidal or suicidal ideation.     ED Results / Procedures / Treatments   Labs (all labs ordered are listed, but only abnormal results are displayed) Labs Reviewed  COMPREHENSIVE METABOLIC PANEL - Abnormal; Notable for the following components:      Result Value   Potassium 2.9 (*)    Calcium 8.2 (*)    Total Protein 5.7 (*)    Albumin 3.0 (*)    Anion gap 4 (*)    All other components within normal limits  CBC WITH DIFFERENTIAL/PLATELET - Abnormal; Notable for the following components:   RBC 3.73 (*)    Hemoglobin 10.3 (*)    HCT 32.5 (*)    All other components within normal limits  SALICYLATE LEVEL - Abnormal; Notable for the following components:   Salicylate Lvl [id number] (*)    All other components within normal limits  ACETAMINOPHEN LEVEL - Abnormal; Notable for the following components:   Acetaminophen (Tylenol), Serum <10 (*)    All other components within normal limits  ETHANOL  HCG, QUANTITATIVE, PREGNANCY  RAPID URINE DRUG SCREEN, HOSP PERFORMED    EKG EKG Interpretation  Date/Time:  Thursday March 27 2022 06:37:30 EDT Ventricular Rate:  56 PR Interval:  173 QRS Duration: 98 QT  Interval:  390 QTC Calculation: 377 R Axis:   62 Text Interpretation: Sinus rhythm Confirmed by Addison Lank [phone]) on 03/27/2022 6:38:54 AM  Radiology No results found.  Procedures Procedures    Medications Ordered in ED Medications  naloxone (NARCAN) nasal spray 4 mg/0.1 mL (has no administration in time range)  potassium chloride SA (KLOR-CON M) CR tablet 40 mEq (40 mEq Oral  Given 03/27/22 0818)    ED Course/ Medical Decision Making/ A&P                             Medical Decision Making Amount and/or Complexity of Data Reviewed Labs: ordered.  Risk Prescription drug management.   22 year old female presents after accidental overdose with heroin.  Received Narcan from significant other and an additional dose in route.  Denies any other ingestions.  Patient is somnolent but maintaining airway will check labs and continue to monitor.  Labs overall reassuring, mild hypokalemia, p.o. potassium replacement given after patient was more awake and alert and she tolerated well, no signs of other coingestants.  Negative pregnancy.  Patient observed in the ED for 3 hours, she is now more awake and alert, able to tolerate food and p.o. fluids and has been ambulatory.  She was provided a dose of Narcan to keep with her in case of additional overdose and provided resources for substance treatment.  Discharged home with his partner in stable condition.        Final Clinical Impression(s) / ED Diagnoses Final diagnoses:  Accidental overdose of heroin, initial encounter [facility])    Rx / DC Orders ED Discharge Orders     None         Legrand Rams 04/10/22 1555    Virgina Norfolk, DO 04/15/22 1502

## 2022-03-27 NOTE — Discharge Instructions (Signed)
Use resources on your paperwork today to help with substance abuse treatment. Keep narcan with you at all times to use in case of overdose.

## 2022-03-27 NOTE — ED Triage Notes (Signed)
Pt BIB EMS from a hotel after using heroin. Per EMS, pt was alert on scene but became drowsier upon arrival.   110/80 45 hr 71 cbg 100% RA  22g Right Hand 4mg  narcan IN

## 2022-04-17 ENCOUNTER — Encounter (HOSPITAL_COMMUNITY): Payer: Self-pay

## 2022-04-17 ENCOUNTER — Ambulatory Visit (HOSPITAL_COMMUNITY)
Admission: EM | Admit: 2022-04-17 | Discharge: 2022-04-17 | Disposition: A | Discharge status: Home / Self Care | Payer: 59

## 2022-04-17 ENCOUNTER — Emergency Department (HOSPITAL_COMMUNITY)
Admission: EM | Admit: 2022-04-17 | Discharge: 2022-04-17 | Disposition: A | Discharge status: Home / Self Care | Payer: Managed Care, Other (non HMO) | Attending: Emergency Medicine | Admitting: Emergency Medicine

## 2022-04-17 DIAGNOSIS — S01512A Laceration without foreign body of oral cavity, initial encounter: Secondary | ICD-10-CM | POA: Diagnosis not present

## 2022-04-17 DIAGNOSIS — S00512A Abrasion of oral cavity, initial encounter: Secondary | ICD-10-CM | POA: Diagnosis present

## 2022-04-17 DIAGNOSIS — F1490 Cocaine use, unspecified, uncomplicated: Secondary | ICD-10-CM

## 2022-04-17 DIAGNOSIS — F1190 Opioid use, unspecified, uncomplicated: Secondary | ICD-10-CM | POA: Insufficient documentation

## 2022-04-17 DIAGNOSIS — F1110 Opioid abuse, uncomplicated: Secondary | ICD-10-CM

## 2022-04-17 DIAGNOSIS — F1910 Other psychoactive substance abuse, uncomplicated: Secondary | ICD-10-CM | POA: Diagnosis not present

## 2022-04-17 DIAGNOSIS — F1510 Other stimulant abuse, uncomplicated: Secondary | ICD-10-CM

## 2022-04-17 NOTE — Discharge Instructions (Signed)
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
## 2022-04-17 NOTE — Progress Notes (Signed)
   04/17/22 2108  [facility] Triage Screening (Walk-ins at [facility] only)  How Did You Hear About [name]? Family/Friend  What Is the Reason for Your Visit/Call Today? Pt presents to [facility] voluntarily, accompanied by boyfriend and mom requesting detox/substance abuse treatment. Pt reports using heroine and last used earlier today. Pt has history of substance abuse, bipolar disorder and recent accidental overdose on 03/27/22. Denies alcohol use.  Pt denies SI, HI, AVH.  How Long Has This Been Causing You Problems? 1-6 months  Have You Recently Had Any Thoughts About Hurting Yourself? No  Are You Planning to Commit Suicide/Harm Yourself At This time? No  Have you Recently Had Thoughts About Hurting Someone Karolee Ohs? No  Are You Planning To Harm Someone At This Time? No  Are you currently experiencing any auditory, visual or other hallucinations? No  Have You Used Any Alcohol or Drugs in the Past 24 Hours? Yes  How long ago did you use Drugs or Alcohol? today  What Did You Use and How Much? unknown amount of heroine (injected)  Do you have any current medical co-morbidities that require immediate attention? No  Clinician description of patient physical appearance/behavior: Pt is calm, cooperative, casually dressed  What Do You Feel Would Help You the Most Today? Alcohol or Drug Use Treatment  If access to [facility] was not available, would you have sought care in the Emergency Department? No  Determination of Need Urgent (48 hours)  Options For Referral Other: Comment;Outpatient Therapy;Facility-Based Crisis
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
## 2022-04-17 NOTE — ED Provider Notes (Addendum)
Behavioral Health Urgent Care Medical Screening Exam  Patient Name: Jenny Clark MRN: [id number] Date of Evaluation: 04/17/22 Chief Complaint:  "I'm here with my fiance for detox" Diagnosis:  Final diagnoses:  Polysubstance abuse  Heroin abuse  Methamphetamine abuse  Crack cocaine use    History of Present illness: Jenny Clark is a 22 y.o. female with past psychiatric history significant for bipolar depression, MDD, PTSD, opiate use, and borderline personality disorder, who [facility] as a walk[facility]in with her fiance, seeking detox from heroin, meth and crack.  Patient was seen face-to-face by this provider and chart reviewed.  Patient is well-known to the behavioral health service line, and has had 6 ED visits in the last 6 months.  Per chart review, [facility] with [facility] of [facility]. [facility] between 01/05/22-01/11/22.  Tonight, patient reports" I'm just looking for a long term detox place for heroin, I started using heroin in November 2023, I use 2-3 grams daily and I last used this afternoon.  Patient denies experiencing current withdrawal symptoms.  Patient reports she injects or snorts the heroin.   Patient also endorses injecting methamphetamine and smoking crack daily, and last used both substances yesterday. Patient denies other substance use. Patient denies experiencing current withdrawal symptoms.Patient denies history of seizures with drug use.  Patient denies history of detox treatments in the past, and reports she would prefer outpatient or long term detox programs.  Patient reports she is currently homeless with her fiance and looking for shelter resources.  Patient denies SI, denies HI, denies AVH or paranoia. Patient reports fair sleep and poor appetite. Support, encouragement and reassurance provided about ongoing stressors.  On evaluation, patient is alert, oriented x 4, and  cooperative. Speech is clear and coherent. Pt appears casual. Eye contact is fair. Mood is euthymic, affect is congruent with mood. Thought process is goal directed and thought content is WDL. Pt denies SI/HI/AVH or paranoia. There is no objective indication that the patient is responding to internal stimuli. No delusions elicited during this assessment.    Discussed long term substance abuse intensive outpatient programs, and resources provided. Resources also provided for area homeless shelters.  Patient provided with opportunity for questions, and she reports that she will wait for her fiance in the lobby as they both came in together, so they can leave together, as they want to be together in a detox/treatment center/program.   Flowsheet Row [facility] Most recent reading at 04/17/2022  9:22 PM ED [facility]20[facility] Most recent reading at 04/17/2022  2:10 PM [facility] Most recent reading at 03/27/2022  6:35 AM  C-SSRS RISK CATEGORY No Risk No Risk No Risk       Psychiatric Specialty Exam  Presentation  General Appearance:Casual  Eye Contact:Fair  Speech:Clear and Coherent  Speech Volume:Normal  Handedness:Right   Mood and Affect  Mood: Euthymic  Affect: Congruent   Thought Process  Thought Processes: Goal Directed  Descriptions of Associations:Intact  Orientation:Full (Time, Place and Person)  Thought Content:WDL  Diagnosis of Schizophrenia or Schizoaffective disorder in past: No   Hallucinations:None  Ideas of Reference:None  Suicidal Thoughts:No With Plan; With Means to Carry Out; With Intent  Homicidal Thoughts:No   Sensorium  Memory: Immediate Good  Judgment: Poor  Insight: Fair  Executive Functions  Concentration: Good  Attention Span: Good  Recall: Good  Fund  of Knowledge: Good  Language: Good   Psychomotor Activity  Psychomotor Activity: Normal   Assets  Assets: Desire for Improvement; Communication Skills   Sleep  Sleep: Fair  Number of hours: No data recorded  Physical Exam: Physical Exam Constitutional:      General: She is not in acute distress.    Appearance: She is not diaphoretic.  HENT:     Head: Normocephalic.     Right Ear: External ear normal.     Left Ear: External ear normal.     Nose: No congestion.  Eyes:     General:        Right eye: No discharge.        Left eye: No discharge.  Cardiovascular:     Rate and Rhythm: Normal rate.  Pulmonary:     Effort: No respiratory distress.  Chest:     Chest wall: No tenderness.  Neurological:     Mental Status: She is alert and oriented to person, place, and time.  Psychiatric:        Attention and Perception: Attention and perception normal.        Mood and Affect: Mood and affect normal.        Speech: Speech normal.        Behavior: Behavior is cooperative.        Thought Content: Thought content normal. Thought content is not paranoid or delusional. Thought content does not include homicidal or suicidal ideation. Thought content does not include homicidal or suicidal plan.        Cognition and Memory: Cognition and memory normal.    Review of Systems  Constitutional:  Negative for chills, diaphoresis and fever.  HENT:  Negative for congestion.   Eyes:  Negative for discharge.  Respiratory:  Negative for cough, shortness of breath and wheezing.   Cardiovascular:  Negative for chest pain and palpitations.  Gastrointestinal:  Negative for diarrhea, nausea and vomiting.   Blood pressure 112/72, pulse 95, temperature 98.1 F (36.7 C), temperature source Oral, resp. rate 20, SpO2 100 %, unknown if currently breastfeeding. There is no height or weight on file to calculate BMI.  Musculoskeletal: Strength & Muscle Tone: within normal limits Gait & Station:  normal Patient leans: N/A   [facility] MSE Discharge Disposition for Follow up and Recommendations: Based on my evaluation the patient does not appear to have an emergency medical condition and can be discharged with resources and follow up care in outpatient services for Substance Abuse Intensive Outpatient Program  Recommend discharge and follow-up with outpatient services for substance abuse intensive outpatient program.  Patient is in agreement.  Patient is provided resources for long term outpatient substance abuse treatment programs/centers, and also resources for area homeless shelters.  Patient does not meet inpatient psychiatric admission criteria or IVC criteria.  There is no evidence of imminent risk of harm to self or others.  Discussed methods to reduce the risk of self-injury or suicide attempts: Frequent conversations regarding unsafe thoughts. Remove all significant sharps. Remove all firearms. Remove all medications, including over-the-counter meds. Consider lockbox for medications and having a responsible person dispense medications until patient has strengthened coping skills. Room checks for sharps or other harmful objects. Secure all chemical substances that can be ingested or inhaled.   Please refrain from using alcohol or illicit substances, as they can affect your mood and can cause depression, anxiety or other  concerning symptoms.  Alcohol can increase the chance that a person will make reckless decisions, like attempting suicide, and can increase the lethality of a drug overdose.    Discussed crisis plan, calling 911, or go to the ED if condition changes or worsens.  Patient verbalizes understanding.  Patient discharged with resources condition at discharge stable.  Mancel Bale, NP 04/17/2022, 10:18 PM

## 2022-04-28 ENCOUNTER — Ambulatory Visit (HOSPITAL_COMMUNITY)
Admission: EM | Admit: 2022-04-28 | Discharge: 2022-04-29 | Disposition: A | Discharge status: Other Acute Inpatient Hospital | Payer: 59 | Attending: Psychiatry | Admitting: Psychiatry

## 2022-04-28 DIAGNOSIS — F1910 Other psychoactive substance abuse, uncomplicated: Secondary | ICD-10-CM | POA: Diagnosis not present

## 2022-04-28 DIAGNOSIS — R45851 Suicidal ideations: Secondary | ICD-10-CM | POA: Insufficient documentation

## 2022-04-28 DIAGNOSIS — Z5900 Homelessness unspecified: Secondary | ICD-10-CM | POA: Diagnosis not present

## 2022-04-28 DIAGNOSIS — Z1152 Encounter for screening for COVID-19: Secondary | ICD-10-CM | POA: Insufficient documentation

## 2022-04-28 DIAGNOSIS — Z91148 Patient's other noncompliance with medication regimen for other reason: Secondary | ICD-10-CM | POA: Insufficient documentation

## 2022-04-28 LAB — POCT URINE DRUG SCREEN - MANUAL ENTRY (I-SCREEN)
POC Amphetamine UR: NOT DETECTED
POC Buprenorphine (BUP): NOT DETECTED
POC Cocaine UR: POSITIVE — AB
POC Marijuana UR: POSITIVE — AB
POC Methadone UR: NOT DETECTED
POC Methamphetamine UR: NOT DETECTED
POC Morphine: NOT DETECTED
POC Oxazepam (BZO): NOT DETECTED
POC Oxycodone UR: NOT DETECTED
POC Secobarbital (BAR): NOT DETECTED

## 2022-04-28 LAB — POCT PREGNANCY, URINE: Preg Test, Ur: NEGATIVE

## 2022-04-28 LAB — POC SARS CORONAVIRUS 2 AG: SARSCOV2ONAVIRUS 2 AG: NEGATIVE

## 2022-04-28 MED ORDER — ALUM & MAG HYDROXIDE-SIMETH 200-200-20 MG/5ML PO SUSP: 30.0000 mL | ORAL | Status: DC | PRN

## 2022-04-28 MED ORDER — ACETAMINOPHEN 325 MG PO TABS
650.0000 mg | ORAL_TABLET | Freq: Four times a day (QID) | ORAL | Status: DC | PRN
  Administered 2022-04-29: 650 mg via ORAL
  Filled 2022-04-28: qty 2

## 2022-04-28 MED ORDER — OLANZAPINE 5 MG PO TBDP: 5.0000 mg | ORAL_TABLET | Freq: Three times a day (TID) | ORAL | Status: DC | PRN

## 2022-04-28 MED ORDER — MAGNESIUM HYDROXIDE 400 MG/5ML PO SUSP: 30.0000 mL | Freq: Every day | ORAL | Status: DC | PRN

## 2022-04-28 MED ORDER — ZIPRASIDONE MESYLATE 20 MG IM SOLR: 20.0000 mg | INTRAMUSCULAR | Status: DC | PRN

## 2022-04-28 NOTE — ED Provider Notes (Signed)
[facility]gent [facility] Continuous Assessment Admission H&P  Date: 04/29/22 Patient Name: Jenny Clark MRN: [id number] Chief Complaint: I'm extremely suicidal  Diagnoses:  Final diagnoses:  Suicidal ideation  Polysubstance abuse  Homelessness  H/O medication noncompliance    HPI: Jenny Clark,  22 y/o female, with a history of suicidal ideation, polysubstance abuse, homelessness.  Presented to [facility] voluntarily.  Per the patient " I am extremely suicidal", when asked what is her plan patient says to overdose on drugs or overdose on medications.  According to patient she last used heroin and crack like an hour ago before coming in.  Patient report she is homeless she and her boyfriend lives on the street.  Patient reported her boyfriend got arrested today.  A review of patient records show patient has multiple ER visits for the same complications.  Patient was seen on 04/17/22.  According to patient's her family stays in the [facility] area.  Patient reports she was hospitalized for drug overdose several months ago.  According to patient she tried to overdose 2 days ago and her boyfriend gave her Narcan.  Face-to-face observation of patient, patient is alert and oriented x 4, speech is clear.  Maintain minimal to no eye contact.  Patient endorsed suicidal ideation stating she is extremely suicidal.  Patient denies HI, AVH or paranoia.  Patient reports she last used heroin and crack prior to coming in today. Patient denies history of detox treatment in the past.  Patient denies access to guns. Pt report non compliance with medication regimen,  stating she can get a ride to go to the pharmacy   Recommend inpatient observation   Total Time spent with patient: 30 minutes  Musculoskeletal  Strength & Muscle Tone: within normal limits Gait & Station: normal Patient leans: N/A  Psychiatric Specialty Exam  Presentation General Appearance:  Casual  Eye Contact: Good  Speech: Clear and  Coherent  Speech Volume: Normal  Handedness: Right   Mood and Affect  Mood: Anxious  Affect: Appropriate   Thought Process  Thought Processes: Coherent  Descriptions of Associations:Intact  Orientation:Full (Time, Place and Person)  Thought Content:WDL  Diagnosis of Schizophrenia or Schizoaffective disorder in past: No   Hallucinations:Hallucinations: None  Ideas of Reference:None  Suicidal Thoughts:Suicidal Thoughts: Yes, Active SI Active Intent and/or Plan: With Intent; With Plan  Homicidal Thoughts:Homicidal Thoughts: No   Sensorium  Memory: Immediate Fair  Judgment: Poor  Insight: Poor   Executive Functions  Concentration: Good  Attention Span: Good  Recall: Good  Fund of Knowledge: Good  Language: Good   Psychomotor Activity  Psychomotor Activity: Psychomotor Activity: Normal   Assets  Assets: Desire for Improvement   Sleep  Sleep: Sleep: Fair   Nutritional Assessment (For OBS and FBC admissions only) Has the patient had a weight loss or gain of 10 pounds or more in the last 3 months?: No Has the patient had a decrease in food intake/or appetite?: No Does the patient have dental problems?: No Does the patient have eating habits or behaviors that may be indicators of an eating disorder including binging or inducing vomiting?: No Has the patient recently lost weight without trying?: 0 Has the patient been eating poorly because of a decreased appetite?: 0 Malnutrition Screening Tool Score: 0    Physical Exam HENT:     Head: Normocephalic.     Nose: Nose normal.  Cardiovascular:     Rate and Rhythm: Normal rate.  Pulmonary:     Effort: Pulmonary effort  is normal.  Musculoskeletal:        General: Normal range of motion.     Cervical back: Normal range of motion.  Neurological:     General: No focal deficit present.     Mental Status: She is alert.  Psychiatric:        Mood and Affect: Mood normal.         Behavior: Behavior normal.        Thought Content: Thought content normal.        Judgment: Judgment normal.    Review of Systems  Constitutional: Negative.   HENT: Negative.    Eyes: Negative.   Respiratory: Negative.    Cardiovascular: Negative.   Gastrointestinal: Negative.   Genitourinary: Negative.   Musculoskeletal: Negative.   Skin: Negative.   Neurological: Negative.   Psychiatric/Behavioral:  Positive for substance abuse and suicidal ideas. The patient is nervous/anxious.     Blood pressure 104/66, pulse 100, temperature 98.1 F (36.7 C), temperature source Oral, resp. rate 18, SpO2 100 %, unknown if currently breastfeeding. There is no height or weight on file to calculate BMI.  Past Psychiatric History: Polysubstance abuse, homelessness,  Is the patient at risk to self? Yes  Has the patient been a risk to self in the past 6 months? Yes .    Has the patient been a risk to self within the distant past? Yes   Is the patient a risk to others? No   Has the patient been a risk to others in the past 6 months? No   Has the patient been a risk to others within the distant past? No   Past Medical History: See chart  Family History: Unknown  Social History: Polysubstance abuse  Last Labs:  Admission on 04/28/2022  Component Date Value Ref Range Status   SARS Coronavirus 2 by RT PCR 04/29/2022 NEGATIVE  NEGATIVE Final   Performed at [facility], [address] [place], [place] [address]   WBC 04/28/2022 9.2  4.0 - 10.5 K/uL Final   RBC 04/28/2022 4.82  3.87 - 5.11 MIL/uL Final   Hemoglobin 04/28/2022 12.7  12.0 - 15.0 g/dL Final   HCT 60/45/4098 41.3  36.0 - 46.0 % Final   MCV 04/28/2022 85.7  80.0 - 100.0 fL Final   MCH 04/28/2022 26.3  26.0 - 34.0 pg Final   MCHC 04/28/2022 30.8  30.0 - 36.0 g/dL Final   RDW 11/91/4782 14.6  11.5 - 15.5 % Final   Platelets 04/28/2022 345  150 - 400 K/uL Final   nRBC 04/28/2022 0.0  0.0 - 0.2 % Final   Neutrophils Relative  % 04/28/2022 70  % Final   Neutro Abs 04/28/2022 6.4  1.7 - 7.7 K/uL Final   Lymphocytes Relative 04/28/2022 24  % Final   Lymphs Abs 04/28/2022 2.2  0.7 - 4.0 K/uL Final   Monocytes Relative 04/28/2022 5  % Final   Monocytes Absolute 04/28/2022 0.5  0.1 - 1.0 K/uL Final   Eosinophils Relative 04/28/2022 1  % Final   Eosinophils Absolute 04/28/2022 0.1  0.0 - 0.5 K/uL Final   Basophils Relative 04/28/2022 0  % Final   Basophils Absolute 04/28/2022 0.0  0.0 - 0.1 K/uL Final   Immature Granulocytes 04/28/2022 0  % Final   Abs Immature Granulocytes 04/28/2022 0.02  0.00 - 0.07 K/uL Final   Performed at [facility], [address] [place], [place] [address]   Sodium 04/28/2022 137  135 - 145 mmol/L Final   Potassium 04/28/2022 4.1  3.5 - 5.1 mmol/L Final   Chloride 04/28/2022 99  98 - 111 mmol/L Final   CO2 04/28/2022 26  22 - 32 mmol/L Final   Glucose, Bld 04/28/2022 83  70 - 99 mg/dL Final   Glucose reference range applies only to samples taken after fasting for at least 8 hours.   BUN 04/28/2022 9  6 - 20 mg/dL Final   Creatinine, Ser 04/28/2022 0.70  0.44 - 1.00 mg/dL Final   Calcium 16/10/9602 9.4  8.9 - 10.3 mg/dL Final   Total Protein 54/09/8117 7.3  6.5 - 8.1 g/dL Final   Albumin 14/78/2956 3.8  3.5 - 5.0 g/dL Final   AST 21/30/8657 16  15 - 41 U/L Final   ALT 04/28/2022 12  0 - 44 U/L Final   Alkaline Phosphatase 04/28/2022 90  38 - 126 U/L Final   Total Bilirubin 04/28/2022 0.4  0.3 - 1.2 mg/dL Final   GFR, Estimated 04/28/2022 >60  >60 mL/min Final   Comment: (NOTE) Calculated using the CKD-EPI Creatinine Equation (2021)    Anion gap 04/28/2022 12  5 - 15 Final   Performed at [facility], [address] [place], [place] [address]   Hgb A1c MFr Bld 04/28/2022 5.5  4.8 - 5.6 % Final   Comment: (NOTE) Pre diabetes:          5.7%-6.4%  Diabetes:              >6.4%  Glycemic control for   <7.0% adults with diabetes    Mean Plasma Glucose 04/28/2022 111.15   mg/dL Final   Performed at [facility], [address] [place], [place] [address]   Alcohol, Ethyl (B) 04/28/2022 <10  <10 mg/dL Final   Comment: (NOTE) Lowest detectable limit for serum alcohol is 10 mg/dL.  For medical purposes only. Performed at [name], [address], [place], [place] [address]    POC Amphetamine UR 04/28/2022 None Detected  NONE DETECTED (Cut Off Level 1000 ng/mL) Final   POC Secobarbital (BAR) 04/28/2022 None Detected  NONE DETECTED (Cut Off Level 300 ng/mL) Final   POC Buprenorphine (BUP) 04/28/2022 None Detected  NONE DETECTED (Cut Off Level 10 ng/mL) Final   POC Oxazepam (BZO) 04/28/2022 None Detected  NONE DETECTED (Cut Off Level 300 ng/mL) Final   POC Cocaine UR 04/28/2022 Positive (A)  NONE DETECTED (Cut Off Level 300 ng/mL) Final   POC Methamphetamine UR 04/28/2022 None Detected  NONE DETECTED (Cut Off Level 1000 ng/mL) Final   POC Morphine 04/28/2022 None Detected  NONE DETECTED (Cut Off Level 300 ng/mL) Final   POC Methadone UR 04/28/2022 None Detected  NONE DETECTED (Cut Off Level 300 ng/mL) Final   POC Oxycodone UR 04/28/2022 None Detected  NONE DETECTED (Cut Off Level 100 ng/mL) Final   POC Marijuana UR 04/28/2022 Positive (A)  NONE DETECTED (Cut Off Level 50 ng/mL) Final   SARSCOV2ONAVIRUS 2 AG 04/28/2022 NEGATIVE  NEGATIVE Final   Comment: (NOTE) SARS-CoV-2 antigen NOT DETECTED.   Negative results are presumptive.  Negative results do not preclude SARS-CoV-2 infection and should not be used as the sole basis for treatment or other patient management decisions, including infection  control decisions, particularly in the presence of clinical signs and  symptoms consistent with COVID-19, or in those who have been in contact with the virus.  Negative results must be combined with clinical observations, patient  history, and epidemiological information. The expected result is Negative.  Fact Sheet for Patients:  https://www.jennings-kim.com/  Fact Sheet for Healthcare Providers: https://alexander-rogers.biz/  This test is not yet approved or cleared by the [place] FDA and  has been authorized for detection and/or diagnosis of SARS-CoV-2 by FDA under an Emergency Use Authorization (EUA).  This EUA will remain in effect (meaning this test can be used) for the duration of  the COV                          ID-19 declaration under Section 564(b)(1) of the Act, 21 U.S.C. section 360bbb-3(b)(1), unless the authorization is terminated or revoked sooner.     Preg Test, Ur 04/28/2022 NEGATIVE  NEGATIVE Final   Comment:        THE SENSITIVITY OF THIS METHODOLOGY IS >24 mIU/mL    TSH 04/28/2022 0.322 (L)  0.350 - 4.500 uIU/mL Final   Comment: Performed by a 3rd Generation assay with a functional sensitivity of <=0.01 uIU/mL. Performed at [facility], [address], [place], [place] [address]   Admission on 03/27/2022, Discharged on 03/27/2022  Component Date Value Ref Range Status   Sodium 03/27/2022 137  135 - 145 mmol/L Final   Potassium 03/27/2022 2.9 (L)  3.5 - 5.1 mmol/L Final   Chloride 03/27/2022 108  98 - 111 mmol/L Final   CO2 03/27/2022 25  22 - 32 mmol/L Final   Glucose, Bld 03/27/2022 96  70 - 99 mg/dL Final   Glucose reference range applies only to samples taken after fasting for at least 8 hours.   BUN 03/27/2022 8  6 - 20 mg/dL Final   Creatinine, Ser 03/27/2022 0.60  0.44 - 1.00 mg/dL Final   Calcium 82/95/6213 8.2 (L)  8.9 - 10.3 mg/dL Final   Total Protein 08/65/7846 5.7 (L)  6.5 - 8.1 g/dL Final   Albumin 96/29/5284 3.0 (L)  3.5 - 5.0 g/dL Final   AST 13/24/4010 22  15 - 41 U/L Final   ALT 03/27/2022 17  0 - 44 U/L Final   Alkaline Phosphatase 03/27/2022 61  38 - 126 U/L Final   Total Bilirubin 03/27/2022 0.3  0.3 - 1.2 mg/dL Final   GFR, Estimated 03/27/2022 >60  >60 mL/min Final   Comment: (NOTE) Calculated using the CKD-EPI Creatinine  Equation (2021)    Anion gap 03/27/2022 4 (L)  5 - 15 Final   Performed at [facility], [address], [place], [place] [address]   WBC 03/27/2022 5.8  4.0 - 10.5 K/uL Final   RBC 03/27/2022 3.73 (L)  3.87 - 5.11 MIL/uL Final   Hemoglobin 03/27/2022 10.3 (L)  12.0 - 15.0 g/dL Final   HCT 66/44/0347 32.5 (L)  36.0 - 46.0 % Final   MCV 03/27/2022 87.1  80.0 - 100.0 fL Final   MCH 03/27/2022 27.6  26.0 - 34.0 pg Final   MCHC 03/27/2022 31.7  30.0 - 36.0 g/dL Final   RDW 42/59/5638 15.0  11.5 - 15.5 % Final   Platelets 03/27/2022 203  150 - 400 K/uL Final   nRBC 03/27/2022 0.0  0.0 - 0.2 % Final   Neutrophils Relative % 03/27/2022 39  % Final   Neutro Abs 03/27/2022 2.3  1.7 - 7.7 K/uL Final   Lymphocytes Relative 03/27/2022 47  % Final   Lymphs Abs 03/27/2022 2.8  0.7 - 4.0 K/uL Final   Monocytes Relative 03/27/2022 9  %  Final   Monocytes Absolute 03/27/2022 0.5  0.1 - 1.0 K/uL Final   Eosinophils Relative 03/27/2022 4  % Final   Eosinophils Absolute 03/27/2022 0.2  0.0 - 0.5 K/uL Final   Basophils Relative 03/27/2022 1  % Final   Basophils Absolute 03/27/2022 0.1  0.0 - 0.1 K/uL Final   Immature Granulocytes 03/27/2022 0  % Final   Abs Immature Granulocytes 03/27/2022 0.01  0.00 - 0.07 K/uL Final   Performed at [facility], [address], [place], [place] [address]   Alcohol, Ethyl (B) 03/27/2022 <10  <10 mg/dL Final   Comment: (NOTE) Lowest detectable limit for serum alcohol is 10 mg/dL.  For medical purposes only. Performed at [facility], [address], [place], [place] [address]    Salicylate Lvl 03/27/2022 <7.0 (L)  7.0 - 30.0 mg/dL Final   Performed at [facility], [address] [place], [place] [address]   Acetaminophen (Tylenol), Serum 03/27/2022 <10 (L)  10 - 30 ug/mL Final   Comment: (NOTE) Therapeutic concentrations vary significantly. A range of 10-30 ug/mL  may be an effective  concentration for many patients. However, some  are best treated at concentrations outside of this range. Acetaminophen concentrations >150 ug/mL at 4 hours after ingestion  and >50 ug/mL at 12 hours after ingestion are often associated with  toxic reactions.  Performed at [facility], [address], [place], [place] [address]    hCG, Conley Rolls, Quant, Kathie Rhodes 03/27/2022 <1  <5 mIU/mL Final   Comment:          GEST. AGE      CONC.  (mIU/mL)   <=1 WEEK        5 - 50     2 WEEKS       50 - 500     3 WEEKS       100 - 10,000     4 WEEKS     1,000 - 30,000     5 WEEKS     3,500 - 115,000   6-8 WEEKS     12,000 - 270,000    12 WEEKS     15,000 - 220,000        FEMALE AND NON-PREGNANT FEMALE:     LESS THAN 5 mIU/mL Performed at [facility], [address], [place], [place] [address]   Admission on 01/04/2022, Discharged on 01/05/2022  Component Date Value Ref Range Status   SARS Coronavirus 2 by RT PCR 01/04/2022 NEGATIVE  NEGATIVE Final   Comment: (NOTE) SARS-CoV-2 target nucleic acids are NOT DETECTED.  The SARS-CoV-2 RNA is generally detectable in upper respiratory specimens during the acute phase of infection. The lowest concentration of SARS-CoV-2 viral copies this assay can detect is 138 copies/mL. A negative result does not preclude SARS-Cov-2 infection and should not be used as the sole basis for treatment or other patient management decisions. A negative result may occur with  improper specimen collection/handling, submission of specimen other than nasopharyngeal swab, presence of viral mutation(s) within the areas targeted by this assay, and inadequate number of viral copies(<138 copies/mL). A negative result must be combined with clinical observations, patient history, and epidemiological information. The expected result is Negative.  Fact Sheet for Patients:  BloggerCourse.com  Fact Sheet for Healthcare  Providers:  SeriousBroker.it  This test is no  t yet approved or cleared by the [place] and  has been authorized for detection and/or diagnosis of SARS-CoV-2 by FDA under an Emergency Use Authorization (EUA). This EUA will remain  in effect (meaning this test can be used) for the duration of the COVID-19 declaration under Section 564(b)(1) of the Act, 21 U.S.C.section 360bbb-3(b)(1), unless the authorization is terminated  or revoked sooner.       Influenza A by PCR 01/04/2022 NEGATIVE  NEGATIVE Final   Influenza B by PCR 01/04/2022 NEGATIVE  NEGATIVE Final   Comment: (NOTE) The Xpert Xpress SARS-CoV-2/FLU/RSV plus assay is intended as an aid in the diagnosis of influenza from Nasopharyngeal swab specimens and should not be used as a sole basis for treatment. Nasal washings and aspirates are unacceptable for Xpert Xpress SARS-CoV-2/FLU/RSV testing.  Fact Sheet for Patients: BloggerCourse.com  Fact Sheet for Healthcare Providers: SeriousBroker.it  This test is not yet approved or cleared by the [place] FDA and has been authorized for detection and/or diagnosis of SARS-CoV-2 by FDA under an Emergency Use Authorization (EUA). This EUA will remain in effect (meaning this test can be used) for the duration of the COVID-19 declaration under Section 564(b)(1) of the Act, 21 U.S.C. section 360bbb-3(b)(1), unless the authorization is terminated or revoked.     Resp Syncytial Virus by PCR 01/04/2022 NEGATIVE  NEGATIVE Final   Comment: (NOTE) Fact Sheet for Patients: BloggerCourse.com  Fact Sheet for Healthcare Providers: SeriousBroker.it  This test is not yet approved or cleared by the [place] FDA and has been authorized for detection and/or diagnosis of SARS-CoV-2 by FDA under an Emergency Use Authorization  (EUA). This EUA will remain in effect (meaning this test can be used) for the duration of the COVID-19 declaration under Section 564(b)(1) of the Act, 21 U.S.C. section 360bbb-3(b)(1), unless the authorization is terminated or revoked.  Performed at [facility], [address], [place], [place] [address]    WBC 01/04/2022 8.9  4.0 - 10.5 K/uL Final   RBC 01/04/2022 4.70  3.87 - 5.11 MIL/uL Final   Hemoglobin 01/04/2022 13.2  12.0 - 15.0 g/dL Final   HCT 56/21/3086 39.6  36.0 - 46.0 % Final   MCV 01/04/2022 84.3  80.0 - 100.0 fL Final   MCH 01/04/2022 28.1  26.0 - 34.0 pg Final   MCHC 01/04/2022 33.3  30.0 - 36.0 g/dL Final   RDW 57/84/6962 14.4  11.5 - 15.5 % Final   Platelets 01/04/2022 271  150 - 400 K/uL Final   nRBC 01/04/2022 0.0  0.0 - 0.2 % Final   Neutrophils Relative % 01/04/2022 63  % Final   Neutro Abs 01/04/2022 5.6  1.7 - 7.7 K/uL Final   Lymphocytes Relative 01/04/2022 30  % Final   Lymphs Abs 01/04/2022 2.6  0.7 - 4.0 K/uL Final   Monocytes Relative 01/04/2022 7  % Final   Monocytes Absolute 01/04/2022 0.6  0.1 - 1.0 K/uL Final   Eosinophils Relative 01/04/2022 0  % Final   Eosinophils Absolute 01/04/2022 0.0  0.0 - 0.5 K/uL Final   Basophils Relative 01/04/2022 0  % Final   Basophils Absolute 01/04/2022 0.0  0.0 - 0.1 K/uL Final   Immature Granulocytes 01/04/2022 0  % Final   Abs Immature Granulocytes 01/04/2022 0.02  0.00 - 0.07 K/uL Final   Performed at [facility], [address], [place], [place] [address]   Sodium 01/04/2022 139  135 - 145 mmol/L Final  Potassium 01/04/2022 3.3 (L)  3.5 - 5.1 mmol/L Final   Chloride 01/04/2022 102  98 - 111 mmol/L Final   CO2 01/04/2022 25  22 - 32 mmol/L Final   Glucose, Bld 01/04/2022 91  70 - 99 mg/dL Final   Glucose reference range applies only to samples taken after fasting for at least 8 hours.   BUN 01/04/2022 8  6 - 20 mg/dL Final   Creatinine, Ser 01/04/2022 0.83  0.44 - 1.00 mg/dL Final   Calcium  16/10/9602 9.4  8.9 - 10.3 mg/dL Final   Total Protein 54/09/8117 7.8  6.5 - 8.1 g/dL Final   Albumin 14/78/2956 4.1  3.5 - 5.0 g/dL Final   AST 21/30/8657 23  15 - 41 U/L Final   ALT 01/04/2022 28  0 - 44 U/L Final   Alkaline Phosphatase 01/04/2022 98  38 - 126 U/L Final   Total Bilirubin 01/04/2022 0.3  0.3 - 1.2 mg/dL Final   GFR, Estimated 01/04/2022 >60  >60 mL/min Final   Comment: (NOTE) Calculated using the CKD-EPI Creatinine Equation (2021)    Anion gap 01/04/2022 12  5 - 15 Final   Performed at [facility], [address] [place], [place] [address]   Hgb A1c MFr Bld 01/04/2022 5.2  4.8 - 5.6 % Final   Comment: (NOTE) Pre diabetes:          5.7%-6.4%  Diabetes:              >6.4%  Glycemic control for   <7.0% adults with diabetes    Mean Plasma Glucose 01/04/2022 102.54  mg/dL Final   Performed at [facility], [address] [place], [place] [address]   Alcohol, Ethyl (B) 01/04/2022 <10  <10 mg/dL Final   Comment: (NOTE) Lowest detectable limit for serum alcohol is 10 mg/dL.  For medical purposes only. Performed at [facility], [address], [place], [place] [address]    Cholesterol 01/04/2022 169  0 - 200 mg/dL Final   Triglycerides 40/10/2723 91  <150 mg/dL Final   HDL 36/64/4034 40 (L)  >40 mg/dL Final   Total CHOL/HDL Ratio 01/04/2022 4.2  RATIO Final   VLDL 01/04/2022 18  0 - 40 mg/dL Final   LDL Cholesterol 01/04/2022 111 (H)  0 - 99 mg/dL Final   Comment:        Total Cholesterol/HDL:CHD Risk Coronary Heart Disease Risk Table                     Men   Women  1/2 Average Risk   3.4   3.3  Average Risk       5.0   4.4  2 X Average Risk   9.6   7.1  3 X Average Risk  23.4   11.0        Use the calculated Patient Ratio above and the CHD Risk Table to determine the patient's CHD Risk.        ATP III CLASSIFICATION (LDL):  <100     mg/dL   Optimal  742-595  mg/dL   Near or Above                    Optimal  130-159  mg/dL    Borderline  638-756  mg/dL   High  >433     mg/dL   Very High Performed at [facility], [address] [place], [place]  [address]    TSH 01/04/2022 1.054  0.350 - 4.500 uIU/mL Final   Comment: Performed by a 3rd Generation assay with a functional sensitivity of <=0.01 uIU/mL. Performed at [facility], [address], [place], [place] [address]    Neisseria Gonorrhea 01/04/2022 Negative   Final   Chlamydia 01/04/2022 Negative   Final   Comment 01/04/2022 Normal Reference Ranger Chlamydia - Negative   Final   Comment 01/04/2022 Normal Reference Range Neisseria Gonorrhea - Negative   Final   RPR Ser Ql 01/04/2022 NON REACTIVE  NON REACTIVE Final   Performed at [facility], [address] [place], [place] [address]   HIV Screen 4th Generation wRfx 01/04/2022 Non Reactive  Non Reactive Final   Performed at [facility], [address] [place], [place] [address]   POC Amphetamine UR 01/04/2022 None Detected  NONE DETECTED (Cut Off Level 1000 ng/mL) Final   POC Secobarbital (BAR) 01/04/2022 None Detected  NONE DETECTED (Cut Off Level 300 ng/mL) Final   POC Buprenorphine (BUP) 01/04/2022 None Detected  NONE DETECTED (Cut Off Level 10 ng/mL) Final   POC Oxazepam (BZO) 01/04/2022 Positive (A)  NONE DETECTED (Cut Off Level 300 ng/mL) Final   POC Cocaine UR 01/04/2022 Positive (A)  NONE DETECTED (Cut Off Level 300 ng/mL) Final   POC Methamphetamine UR 01/04/2022 None Detected  NONE DETECTED (Cut Off Level 1000 ng/mL) Final   POC Morphine 01/04/2022 None Detected  NONE DETECTED (Cut Off Level 300 ng/mL) Final   POC Methadone UR 01/04/2022 None Detected  NONE DETECTED (Cut Off Level 300 ng/mL) Final   POC Oxycodone UR 01/04/2022 None Detected  NONE DETECTED (Cut Off Level 100 ng/mL) Final   POC Marijuana UR 01/04/2022 None Detected  NONE DETECTED (Cut Off Level 50 ng/mL) Final   Color, Urine 01/04/2022 AMBER (A)  YELLOW Final   BIOCHEMICALS MAY BE AFFECTED BY COLOR    APPearance 01/04/2022 TURBID (A)  CLEAR Final   Specific Gravity, Urine 01/04/2022 1.020  1.005 - 1.030 Final   pH 01/04/2022 7.0  5.0 - 8.0 Final   Glucose, UA 01/04/2022 NEGATIVE  NEGATIVE mg/dL Final   Hgb urine dipstick 01/04/2022 NEGATIVE  NEGATIVE Final   Bilirubin Urine 01/04/2022 NEGATIVE  NEGATIVE Final   Ketones, ur 01/04/2022 NEGATIVE  NEGATIVE mg/dL Final   Protein, ur 62/95/2841 100 (A)  NEGATIVE mg/dL Final   Nitrite 32/44/0102 NEGATIVE  NEGATIVE Final   Leukocytes,Ua 01/04/2022 LARGE (A)  NEGATIVE Final   RBC / HPF 01/04/2022 0-5  0 - 5 RBC/hpf Final   WBC, UA 01/04/2022 >50 (H)  0 - 5 WBC/hpf Final   Bacteria, UA 01/04/2022 NONE SEEN  NONE SEEN Final   Squamous Epithelial / HPF 01/04/2022 11-20  0 - 5 /HPF Final   WBC Clumps 01/04/2022 PRESENT   Final   Mucus 01/04/2022 PRESENT   Final   Performed at [facility], [address], [place], [place] [address]   SARSCOV2ONAVIRUS 2 AG 01/04/2022 NEGATIVE  NEGATIVE Final   Comment: (NOTE) SARS-CoV-2 antigen NOT DETECTED.   Negative results are presumptive.  Negative results do not preclude SARS-CoV-2 infection and should not be used as the sole basis for treatment or other patient management decisions, including infection  control decisions, particularly in the presence of clinical signs and  symptoms consistent with COVID-19, or in those who have been in contact with the virus.  Negative results must be combined with clinical observations, patient history,  and epidemiological information. The expected result is Negative.  Fact Sheet for Patients: https://www.jennings-kim.com/  Fact Sheet for Healthcare Providers: https://alexander-rogers.biz/  This test is not yet approved or cleared by the [place] FDA and  has been authorized for detection and/or diagnosis of SARS-CoV-2 by FDA under an Emergency Use Authorization (EUA).  This EUA will remain in effect (meaning this test can be  used) for the duration of  the COV                          ID-19 declaration under Section 564(b)(1) of the Act, 21 U.S.C. section 360bbb-3(b)(1), unless the authorization is terminated or revoked sooner.     SARSCOV2ONAVIRUS 2 AG 01/04/2022 NEGATIVE  NEGATIVE Final   Comment: (NOTE) SARS-CoV-2 antigen NOT DETECTED.   Negative results are presumptive.  Negative results do not preclude SARS-CoV-2 infection and should not be used as the sole basis for treatment or other patient management decisions, including infection  control decisions, particularly in the presence of clinical signs and  symptoms consistent with COVID-19, or in those who have been in contact with the virus.  Negative results must be combined with clinical observations, patient history, and epidemiological information. The expected result is Negative.  Fact Sheet for Patients: https://www.jennings-kim.com/  Fact Sheet for Healthcare Providers: https://alexander-rogers.biz/  This test is not yet approved or cleared by the [place] FDA and  has been authorized for detection and/or diagnosis of SARS-CoV-2 by FDA under an Emergency Use Authorization (EUA).  This EUA will remain in effect (meaning this test can be used) for the duration of  the COV                          ID-19 declaration under Section 564(b)(1) of the Act, 21 U.S.C. section 360bbb-3(b)(1), unless the authorization is terminated or revoked sooner.     Specimen Description 01/04/2022 URINE, CLEAN CATCH   Final   Special Requests 01/04/2022    Final                   Value:Normal Performed at [facility], [address], [place], [place] [address]    Culture 01/04/2022 >=100,000 COLONIES/mL ESCHERICHIA COLI (A)   Final   Report Status 01/04/2022 01/06/2022 FINAL   Final   Organism ID, Bacteria 01/04/2022 ESCHERICHIA COLI (A)   Final  Admission on 11/08/2021, Discharged on 11/09/2021  Component Date Value Ref  Range Status   Sodium 11/08/2021 133 (L)  135 - 145 mmol/L Final   Potassium 11/08/2021 3.1 (L)  3.5 - 5.1 mmol/L Final   Chloride 11/08/2021 102  98 - 111 mmol/L Final   CO2 11/08/2021 12 (L)  22 - 32 mmol/L Final   Glucose, Bld 11/08/2021 110 (H)  70 - 99 mg/dL Final   Glucose reference range applies only to samples taken after fasting for at least 8 hours.   BUN 11/08/2021 10  6 - 20 mg/dL Final   Creatinine, Ser 11/08/2021 0.71  0.44 - 1.00 mg/dL Final   Calcium 78/29/5621 9.9  8.9 - 10.3 mg/dL Final   GFR, Estimated 11/08/2021 >60  >60 mL/min Final   Comment: (NOTE) Calculated using the CKD-EPI Creatinine Equation (2021)    Anion gap 11/08/2021 19 (H)  5 - 15 Final   Performed at [facility], [address] [place], [place] [address]   WBC 11/08/2021 14.3 (H)  4.0 -  10.5 K/uL Final   RBC 11/08/2021 4.81  3.87 - 5.11 MIL/uL Final   Hemoglobin 11/08/2021 13.0  12.0 - 15.0 g/dL Final   HCT 16/10/9602 39.3  36.0 - 46.0 % Final   MCV 11/08/2021 81.7  80.0 - 100.0 fL Final   MCH 11/08/2021 27.0  26.0 - 34.0 pg Final   MCHC 11/08/2021 33.1  30.0 - 36.0 g/dL Final   RDW 54/09/8117 14.5  11.5 - 15.5 % Final   Platelets 11/08/2021 302  150 - 400 K/uL Final   nRBC 11/08/2021 0.0  0.0 - 0.2 % Final   Performed at [facility], [address], [place], [place] [address]   Troponin I (High Sensitivity) 11/08/2021 <2  <18 ng/L Final   Comment: (NOTE) Elevated high sensitivity troponin I (hsTnI) values and significant  changes across serial measurements may suggest ACS but many other  chronic and acute conditions are known to elevate hsTnI results.  Refer to the "Links" section for chest pain algorithms and additional  guidance. Performed at [facility], [address], [place], [place] [address]    Preg Test, Ur 11/08/2021 NEGATIVE  NEGATIVE Final   Comment:        THE SENSITIVITY OF THIS METHODOLOGY IS >20  mIU/mL. Performed at [facility], [address], [place], [place] [address]    Alcohol, Ethyl (B) 11/08/2021 71 (H)  <10 mg/dL Final   Comment: (NOTE) Lowest detectable limit for serum alcohol is 10 mg/dL.  For medical purposes only. Performed at [facility], [address], [place], [place] [address]    D-Dimer, Quant 11/08/2021 0.30  0.00 - 0.50 ug/mL-FEU Final   Comment: (NOTE) At the manufacturer cut-off value of 0.5 g/mL FEU, this assay has a negative predictive value of 95-100%.This assay is intended for use in conjunction with a clinical pretest probability (PTP) assessment model to exclude pulmonary embolism (PE) and deep venous thrombosis (DVT) in outpatients suspected of PE or DVT. Results should be correlated with clinical presentation. Performed at [facility], [address] [place], [place] [address]    Total Protein 11/08/2021 8.2 (H)  6.5 - 8.1 g/dL Final   Albumin 84/13/2440 4.3  3.5 - 5.0 g/dL Final   AST 11/02/2534 26  15 - 41 U/L Final   ALT 11/08/2021 22  0 - 44 U/L Final   Alkaline Phosphatase 11/08/2021 100  38 - 126 U/L Final   Total Bilirubin 11/08/2021 0.3  0.3 - 1.2 mg/dL Final   Bilirubin, Direct 11/08/2021 0.1  0.0 - 0.2 mg/dL Final   Indirect Bilirubin 11/08/2021 0.2 (L)  0.3 - 0.9 mg/dL Final   Performed at [facility], [address] [place], [place] [address]   Color, Urine 11/08/2021 COLORLESS (A)  YELLOW Final   APPearance 11/08/2021 CLEAR  CLEAR Final   Specific Gravity, Urine 11/08/2021 1.005  1.005 - 1.030 Final   pH 11/08/2021 6.0  5.0 - 8.0 Final   Glucose, UA 11/08/2021 NEGATIVE  NEGATIVE mg/dL Final   Hgb urine dipstick 11/08/2021 NEGATIVE  NEGATIVE Final   Bilirubin Urine 11/08/2021 NEGATIVE  NEGATIVE Final   Ketones, ur 11/08/2021 NEGATIVE  NEGATIVE mg/dL Final   Protein, ur 47/42/5956 NEGATIVE  NEGATIVE mg/dL Final   Nitrite 38/75/6433 NEGATIVE   NEGATIVE Final   Leukocytes,Ua 11/08/2021 NEGATIVE  NEGATIVE Final   Performed at [facility], [address], [place], [place] [address]   Troponin I (High Sensitivity) 11/09/2021 <2  <18  ng/L Final   Comment: (NOTE) Elevated high sensitivity troponin I (hsTnI) values and significant  changes across serial measurements may suggest ACS but many other  chronic and acute conditions are known to elevate hsTnI results.  Refer to the "Links" section for chest pain algorithms and additional  guidance. Performed at [facility], [address] [place], [place] [address]    Sodium 11/09/2021 139  135 - 145 mmol/L Final   Potassium 11/09/2021 3.8  3.5 - 5.1 mmol/L Final   DELTA CHECK NOTED   Chloride 11/09/2021 104  98 - 111 mmol/L Final   CO2 11/09/2021 24  22 - 32 mmol/L Final   Glucose, Bld 11/09/2021 84  70 - 99 mg/dL Final   Glucose reference range applies only to samples taken after fasting for at least 8 hours.   BUN 11/09/2021 8  6 - 20 mg/dL Final   Creatinine, Ser 11/09/2021 0.73  0.44 - 1.00 mg/dL Final   Calcium 60/45/4098 9.4  8.9 - 10.3 mg/dL Final   GFR, Estimated 11/09/2021 >60  >60 mL/min Final   Comment: (NOTE) Calculated using the CKD-EPI Creatinine Equation (2021)    Anion gap 11/09/2021 11  5 - 15 Final   Performed at [facility], [address], [place], [place] [address]    Allergies: Patient has no known allergies.  Medications:  Facility Ordered Medications  Medication   acetaminophen (TYLENOL) tablet 650 mg   alum & mag hydroxide-simeth (MAALOX/MYLANTA) 200-200-20 MG/5ML suspension 30 mL   magnesium hydroxide (MILK OF MAGNESIA) suspension 30 mL   OLANZapine zydis (ZYPREXA) disintegrating tablet 5 mg   And   ziprasidone (GEODON) injection 20 mg   PTA Medications  Medication Sig   tretinoin (RETIN-A) 0.025 % cream Apply 1 Application topically at bedtime.   Multiple Vitamins-Minerals  (MULTIVITAMIN WITH MINERALS) tablet Take 1 tablet by mouth daily.   methocarbamol (ROBAXIN) 500 MG tablet Take 1 tablet (500 mg total) by mouth 2 (two) times daily.   hydrOXYzine (ATARAX) 25 MG tablet Take 1 tablet (25 mg total) by mouth 3 (three) times daily as needed for anxiety.   propranolol (INDERAL) 10 MG tablet Take 0.5 tablets (5 mg total) by mouth 2 (two) times daily.   FLUoxetine (PROZAC) 40 MG capsule TAKE 1 CAPSULE (40 MG TOTAL) BY MOUTH DAILY.    Medical Decision Making  Inpatient observation  Lab Orders         SARS Coronavirus 2 by RT PCR (hospital order, performed in [facility] hospital lab) *cepheid single result test* Anterior Nasal Swab         CBC with Differential/Platelet         Comprehensive metabolic panel         Hemoglobin A1c         Ethanol         TSH         POC urine preg, ED         POCT Urine Drug Screen - (I-Screen)         POC SARS Coronavirus 2 Ag         Pregnancy, urine POC      Meds ordered this encounter  Medications   acetaminophen (TYLENOL) tablet 650 mg   alum & mag hydroxide-simeth (MAALOX/MYLANTA) 200-200-20 MG/5ML suspension 30 mL   magnesium hydroxide (MILK OF MAGNESIA) suspension 30 mL   AND Linked Order Group    OLANZapine zydis (ZYPREXA) disintegrating tablet 5 mg  ziprasidone (GEODON) injection 20 mg       Recommendations  Based on my evaluation the patient appears to have an emergency medical condition for which I recommend the patient be transferred to the emergency department for further evaluation.  Sindy Guadeloupe, NP 04/29/22  5:36 AM
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
## 2022-04-28 NOTE — ED Notes (Signed)
Patient Admitted to OBS endorsing needing help with detox from cocaine and heroine use. Patient was cooperative during the admission assessment. Skin assessment complete. Belongings inventoried. Patient oriented to unit and unit rules. Snacks offered to patient.  Patient verbalized agreement to treatment plans. Patient verbally contracts for safety while hospitalized. Will monitor for safety.

## 2022-04-29 ENCOUNTER — Other Ambulatory Visit: Payer: Self-pay

## 2022-04-29 ENCOUNTER — Inpatient Hospital Stay (HOSPITAL_COMMUNITY)
Admission: AD | Admit: 2022-04-29 | Discharge: 2022-05-06 | DRG: 885 | Disposition: A | Discharge status: Home / Self Care | Payer: 59 | Source: Intra-hospital | Attending: Psychiatry | Admitting: Psychiatry

## 2022-04-29 ENCOUNTER — Encounter (HOSPITAL_COMMUNITY): Payer: Self-pay | Admitting: Psychiatry

## 2022-04-29 DIAGNOSIS — G4700 Insomnia, unspecified: Secondary | ICD-10-CM | POA: Diagnosis present

## 2022-04-29 DIAGNOSIS — N390 Urinary tract infection, site not specified: Secondary | ICD-10-CM | POA: Diagnosis present

## 2022-04-29 DIAGNOSIS — Z5982 Transportation insecurity: Secondary | ICD-10-CM

## 2022-04-29 DIAGNOSIS — F332 Major depressive disorder, recurrent severe without psychotic features: Principal | ICD-10-CM | POA: Diagnosis present

## 2022-04-29 DIAGNOSIS — Z9151 Personal history of suicidal behavior: Secondary | ICD-10-CM | POA: Diagnosis not present

## 2022-04-29 DIAGNOSIS — F603 Borderline personality disorder: Secondary | ICD-10-CM | POA: Diagnosis present

## 2022-04-29 DIAGNOSIS — F1120 Opioid dependence, uncomplicated: Secondary | ICD-10-CM | POA: Insufficient documentation

## 2022-04-29 DIAGNOSIS — Z5900 Homelessness unspecified: Secondary | ICD-10-CM

## 2022-04-29 DIAGNOSIS — Z91148 Patient's other noncompliance with medication regimen for other reason: Secondary | ICD-10-CM

## 2022-04-29 DIAGNOSIS — F330 Major depressive disorder, recurrent, mild: Secondary | ICD-10-CM

## 2022-04-29 DIAGNOSIS — Z79899 Other long term (current) drug therapy: Secondary | ICD-10-CM | POA: Diagnosis not present

## 2022-04-29 DIAGNOSIS — R0781 Pleurodynia: Secondary | ICD-10-CM | POA: Diagnosis present

## 2022-04-29 DIAGNOSIS — Z62810 Personal history of physical and sexual abuse in childhood: Secondary | ICD-10-CM

## 2022-04-29 DIAGNOSIS — R45851 Suicidal ideations: Secondary | ICD-10-CM | POA: Diagnosis present

## 2022-04-29 DIAGNOSIS — F1124 Opioid dependence with opioid-induced mood disorder: Secondary | ICD-10-CM | POA: Diagnosis present

## 2022-04-29 DIAGNOSIS — F1910 Other psychoactive substance abuse, uncomplicated: Secondary | ICD-10-CM

## 2022-04-29 DIAGNOSIS — Z87891 Personal history of nicotine dependence: Secondary | ICD-10-CM | POA: Diagnosis not present

## 2022-04-29 DIAGNOSIS — F4310 Post-traumatic stress disorder, unspecified: Secondary | ICD-10-CM | POA: Diagnosis present

## 2022-04-29 DIAGNOSIS — F1994 Other psychoactive substance use, unspecified with psychoactive substance-induced mood disorder: Secondary | ICD-10-CM | POA: Diagnosis present

## 2022-04-29 DIAGNOSIS — F429 Obsessive-compulsive disorder, unspecified: Secondary | ICD-10-CM | POA: Diagnosis present

## 2022-04-29 DIAGNOSIS — K5900 Constipation, unspecified: Secondary | ICD-10-CM | POA: Diagnosis present

## 2022-04-29 DIAGNOSIS — Z818 Family history of other mental and behavioral disorders: Secondary | ICD-10-CM

## 2022-04-29 DIAGNOSIS — R7989 Other specified abnormal findings of blood chemistry: Secondary | ICD-10-CM | POA: Diagnosis present

## 2022-04-29 DIAGNOSIS — Z5941 Food insecurity: Secondary | ICD-10-CM | POA: Diagnosis not present

## 2022-04-29 LAB — CBC WITH DIFFERENTIAL/PLATELET
Abs Immature Granulocytes: 0.02 10*3/uL (ref 0.00–0.07)
Basophils Absolute: 0 10*3/uL (ref 0.0–0.1)
Basophils Relative: 0 %
Eosinophils Absolute: 0.1 10*3/uL (ref 0.0–0.5)
Eosinophils Relative: 1 %
HCT: 41.3 % (ref 36.0–46.0)
Hemoglobin: 12.7 g/dL (ref 12.0–15.0)
Immature Granulocytes: 0 %
Lymphocytes Relative: 24 %
Lymphs Abs: 2.2 10*3/uL (ref 0.7–4.0)
MCH: 26.3 pg (ref 26.0–34.0)
MCHC: 30.8 g/dL (ref 30.0–36.0)
MCV: 85.7 fL (ref 80.0–100.0)
Monocytes Absolute: 0.5 10*3/uL (ref 0.1–1.0)
Monocytes Relative: 5 %
Neutro Abs: 6.4 10*3/uL (ref 1.7–7.7)
Neutrophils Relative %: 70 %
Platelets: 345 10*3/uL (ref 150–400)
RBC: 4.82 MIL/uL (ref 3.87–5.11)
RDW: 14.6 % (ref 11.5–15.5)
WBC: 9.2 10*3/uL (ref 4.0–10.5)
nRBC: 0 % (ref 0.0–0.2)

## 2022-04-29 LAB — COMPREHENSIVE METABOLIC PANEL
ALT: 12 U/L (ref 0–44)
AST: 16 U/L (ref 15–41)
Albumin: 3.8 g/dL (ref 3.5–5.0)
Alkaline Phosphatase: 90 U/L (ref 38–126)
Anion gap: 12 (ref 5–15)
BUN: 9 mg/dL (ref 6–20)
CO2: 26 mmol/L (ref 22–32)
Calcium: 9.4 mg/dL (ref 8.9–10.3)
Chloride: 99 mmol/L (ref 98–111)
Creatinine, Ser: 0.7 mg/dL (ref 0.44–1.00)
GFR, Estimated: >60 mL/min (ref >60)
Glucose, Bld: 83 mg/dL (ref 70–99)
Potassium: 4.1 mmol/L (ref 3.5–5.1)
Sodium: 137 mmol/L (ref 135–145)
Total Bilirubin: 0.4 mg/dL (ref 0.3–1.2)
Total Protein: 7.3 g/dL (ref 6.5–8.1)

## 2022-04-29 LAB — URINALYSIS, COMPLETE (UACMP) WITH MICROSCOPIC
Bilirubin Urine: NEGATIVE
Glucose, UA: NEGATIVE mg/dL
Ketones, ur: NEGATIVE mg/dL
Nitrite: NEGATIVE
Protein, ur: 100 mg/dL — AB
Specific Gravity, Urine: 1.021 (ref 1.005–1.030)
WBC, UA: >50 WBC/hpf (ref 0–5)
pH: 7 (ref 5.0–8.0)

## 2022-04-29 LAB — HEMOGLOBIN A1C
Hgb A1c MFr Bld: 5.5 % (ref 4.8–5.6)
Mean Plasma Glucose: 111.15 mg/dL

## 2022-04-29 LAB — SARS CORONAVIRUS 2 BY RT PCR: SARS Coronavirus 2 by RT PCR: NEGATIVE

## 2022-04-29 LAB — ETHANOL: Alcohol, Ethyl (B): <10 mg/dL (ref <10)

## 2022-04-29 LAB — TSH: TSH: 0.322 u[IU]/mL — ABNORMAL LOW (ref 0.350–4.500)

## 2022-04-29 MED ORDER — LORAZEPAM 2 MG/ML IJ SOLN: 2.0000 mg | Freq: Three times a day (TID) | INTRAMUSCULAR | Status: DC | PRN

## 2022-04-29 MED ORDER — LAMOTRIGINE 25 MG PO TABS
25.0000 mg | ORAL_TABLET | Freq: Every day | ORAL | Status: DC
  Administered 2022-04-29: 25 mg via ORAL
  Filled 2022-04-29: qty 1

## 2022-04-29 MED ORDER — FLUOXETINE HCL 20 MG PO CAPS
20.0000 mg | ORAL_CAPSULE | Freq: Every day | ORAL | Status: DC
  Administered 2022-04-29: 20 mg via ORAL
  Filled 2022-04-29: qty 1

## 2022-04-29 MED ORDER — HYDROXYZINE HCL 25 MG PO TABS
25.0000 mg | ORAL_TABLET | Freq: Three times a day (TID) | ORAL | Status: DC | PRN
  Administered 2022-04-29 – 2022-05-06 (×7): 25 mg via ORAL
  Filled 2022-04-29 (×7): qty 1

## 2022-04-29 MED ORDER — LAMOTRIGINE 25 MG PO TABS
25.0000 mg | ORAL_TABLET | Freq: Every day | ORAL | Status: DC
  Administered 2022-04-30 – 2022-05-06 (×7): 25 mg via ORAL
  Filled 2022-04-29 (×10): qty 1

## 2022-04-29 MED ORDER — FLUOXETINE HCL 20 MG PO CAPS: 20.0000 mg | ORAL_CAPSULE | Freq: Every day | ORAL | 3 refills | Status: DC

## 2022-04-29 MED ORDER — HALOPERIDOL 5 MG PO TABS: 5.0000 mg | ORAL_TABLET | Freq: Three times a day (TID) | ORAL | Status: DC | PRN

## 2022-04-29 MED ORDER — NITROFURANTOIN MONOHYD MACRO 100 MG PO CAPS
100.0000 mg | ORAL_CAPSULE | Freq: Two times a day (BID) | ORAL | Status: AC
  Administered 2022-04-29 – 2022-05-04 (×10): 100 mg via ORAL
  Filled 2022-04-29 (×10): qty 1

## 2022-04-29 MED ORDER — LAMOTRIGINE 25 MG PO TABS: 25.0000 mg | ORAL_TABLET | Freq: Every day | ORAL | Status: DC

## 2022-04-29 MED ORDER — LORAZEPAM 1 MG PO TABS: 2.0000 mg | ORAL_TABLET | Freq: Three times a day (TID) | ORAL | Status: DC | PRN

## 2022-04-29 MED ORDER — DIPHENHYDRAMINE HCL 50 MG/ML IJ SOLN: 50.0000 mg | Freq: Three times a day (TID) | INTRAMUSCULAR | Status: DC | PRN

## 2022-04-29 MED ORDER — HALOPERIDOL LACTATE 5 MG/ML IJ SOLN: 5.0000 mg | Freq: Three times a day (TID) | INTRAMUSCULAR | Status: DC | PRN

## 2022-04-29 MED ORDER — FLUOXETINE HCL 20 MG PO CAPS
20.0000 mg | ORAL_CAPSULE | Freq: Every day | ORAL | Status: DC
  Administered 2022-04-30 – 2022-05-06 (×7): 20 mg via ORAL
  Filled 2022-04-29 (×9): qty 1

## 2022-04-29 MED ORDER — NICOTINE POLACRILEX 2 MG MT GUM
2.0000 mg | CHEWING_GUM | OROMUCOSAL | Status: DC | PRN
  Administered 2022-04-29 – 2022-05-05 (×4): 2 mg via ORAL

## 2022-04-29 MED ORDER — DIPHENHYDRAMINE HCL 25 MG PO CAPS: 50.0000 mg | ORAL_CAPSULE | Freq: Three times a day (TID) | ORAL | Status: DC | PRN

## 2022-04-29 MED ORDER — MAGNESIUM HYDROXIDE 400 MG/5ML PO SUSP: 30.0000 mL | Freq: Every day | ORAL | Status: DC | PRN

## 2022-04-29 MED ORDER — ALUM & MAG HYDROXIDE-SIMETH 200-200-20 MG/5ML PO SUSP: 30.0000 mL | ORAL | Status: DC | PRN

## 2022-04-29 MED ORDER — ACETAMINOPHEN 325 MG PO TABS
650.0000 mg | ORAL_TABLET | Freq: Four times a day (QID) | ORAL | Status: DC | PRN
  Administered 2022-04-29 – 2022-05-05 (×5): 650 mg via ORAL
  Filled 2022-04-29 (×5): qty 2

## 2022-04-29 NOTE — Progress Notes (Signed)
Pt was accepted to [facility] TODAY 04/29/2022, pending updated EKG. Bed assignment: 301-1  Pt meets inpatient criteria per Vernard Gambles, NP  Attending Physician will be Phineas Inches, MD  Report can be called to: - Adult unit: [phone]  Pt can arrive after pending items are received  Care Team Notified: [facility] Rona Ravens, RN, Will Phillips Odor, RN, Vernard Gambles, NP, and Feliberto Harts, RN  [place], [place]  04/29/2022 10:46 AM

## 2022-04-29 NOTE — Plan of Care (Signed)
  Problem: Education: Goal: Knowledge of [facility] Education information/materials will improve Outcome: Progressing   Problem: Education: Goal: Knowledge of disease or condition will improve Outcome: Progressing   Problem: Medication: Goal: Compliance with prescribed medication regimen will improve Outcome: Progressing   Problem: Self-Concept: Goal: Ability to disclose and discuss suicidal ideas will improve Outcome: Progressing Goal: Will verbalize positive feelings about self Outcome: Progressing
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
## 2022-04-29 NOTE — ED Notes (Signed)
Pt is in the bed sleeping. Respirations are even and unlabored. No acute distress noted. Will continue to monitor for safety. 
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
## 2022-04-29 NOTE — ED Notes (Signed)
Pt is resting quietly.  Breathing is even and unlabored.  Awaiting provider's eval for dispo 

## 2022-04-29 NOTE — Discharge Instructions (Signed)
Transfer to [facility] for IP admission, Dr. Massengill is the accepting MD  

## 2022-04-29 NOTE — ED Notes (Signed)
Report called to Lawyer at [facility].  Verbalized understanding;

## 2022-04-29 NOTE — Tx Team (Signed)
Initial Treatment Plan 04/29/2022 3:30 PM RAMISA DUMAN WUJ:[id number]    PATIENT STRESSORS: Financial difficulties   Marital or family conflict   Medication change or noncompliance   Substance abuse     PATIENT STRENGTHS: Forensic psychologist fund of knowledge    PATIENT IDENTIFIED PROBLEMS: "I need to detox"  "Suicidal thoughts"                    DISCHARGE CRITERIA:  Improved stabilization in mood, thinking, and/or behavior Need for constant or close observation no longer present  PRELIMINARY DISCHARGE PLAN: Return to previous living arrangement  PATIENT/FAMILY INVOLVEMENT: This treatment plan has been presented to and reviewed with the patient, KALE DOLS.  The patient has been given the opportunity to ask questions and make suggestions.  Edwyna Perfect, RN 04/29/2022, 3:30 PM

## 2022-04-30 ENCOUNTER — Encounter (HOSPITAL_COMMUNITY): Payer: Self-pay

## 2022-04-30 DIAGNOSIS — F332 Major depressive disorder, recurrent severe without psychotic features: Secondary | ICD-10-CM

## 2022-04-30 DIAGNOSIS — F1120 Opioid dependence, uncomplicated: Secondary | ICD-10-CM | POA: Insufficient documentation

## 2022-04-30 MED ORDER — PRAZOSIN HCL 1 MG PO CAPS
1.0000 mg | ORAL_CAPSULE | Freq: Every day | ORAL | Status: DC
  Administered 2022-04-30 – 2022-05-02 (×3): 1 mg via ORAL
  Filled 2022-04-30 (×5): qty 1

## 2022-04-30 MED ORDER — IBUPROFEN 400 MG PO TABS
400.0000 mg | ORAL_TABLET | Freq: Four times a day (QID) | ORAL | Status: DC | PRN
  Administered 2022-05-01 – 2022-05-06 (×9): 400 mg via ORAL
  Filled 2022-04-30 (×9): qty 1
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
## 2022-04-30 NOTE — [facility] Group Notes (Signed)
[facility] Group Notes:  (Nursing/MHT/Case Management/Adjunct)  Date:  04/30/2022  Time:  8:19 PM  Type of Therapy:  Group Therapy  Participation Level:  Active  Participation Quality:  Appropriate  Affect:  Appropriate  Cognitive:  Appropriate  Insight:  Appropriate  Engagement in Group:  Engaged  Modes of Intervention:  Education  Summary of Progress/Problems: Attended NA/wrap up group.  Jenny Clark 04/30/2022, 8:19 PM

## 2022-04-30 NOTE — Group Note (Unsigned)
Date:  04/30/2022 Time:  9:10 AM  Group Topic/Focus:  Goals Group:   The focus of this group is to help patients establish daily goals to achieve during treatment and discuss how the patient can incorporate goal setting into their daily lives to aide in recovery. Orientation:   The focus of this group is to educate the patient on the purpose and policies of crisis stabilization and provide a format to answer questions about their admission.  The group details unit policies and expectations of patients while admitted.     Participation Level:  {B[facility] PARTICIPATION LEVEL:22264}  Participation Quality:  {[facility] PARTICIPATION QUALITY:22265}  Affect:  {[facility] AFFECT:22266}  Cognitive:  {[facility] COGNITIVE:22267}  Insight: {BHH Insight2:20797}  Engagement in Group:  {[facility] ENGAGEMENT IN GROUP:22268}  Modes of Intervention:  {B[facility] MODES OF INTERVENTION:22269}  Additional Comments:  ***  Jenny Clark Jenny Clark 04/30/2022, 9:10 AM  

## 2022-04-30 NOTE — Group Note (Signed)
Recreation Therapy Group Note   Group Topic:Health and Wellness  Group Date: 04/30/2022 Start Time: 0930 End Time: 0955 Facilitators: Tejay Hubert-McCall, LRT,CTRS Location: [address] Dayroom   Goal Area(s) Addresses:  Patient will define components of whole wellness. Patient will verbalize benefit of whole wellness.   Group Description:  Exercise.  LRT and patients discussed the importance on physical exercise and its affects on the body.  LRT then explained to patients they would take turns leading the group in exercises of their choosing.  Patients were told to consider the physical abilities of peers and to anything that would be too challenging.  Patients could take breaks and get water as needed.     Affect/Mood: N/A   Participation Level: Did not attend    Clinical Observations/Individualized Feedback:      Plan: Continue to engage patient in RT group sessions 2-3x/week.   Laderrick Wilk-McCall, LRT,CTRS 04/30/2022 1:21 PM

## 2022-04-30 NOTE — Plan of Care (Signed)
  Problem: Education: Goal: Emotional status will improve Outcome: Progressing Goal: Mental status will improve Outcome: Progressing   Problem: Activity: Goal: Interest or engagement in activities will improve Outcome: Progressing Goal: Sleeping patterns will improve Outcome: Progressing   Problem: Safety: Goal: Periods of time without injury will increase Outcome: Progressing   Problem: Health Behavior/Discharge Planning: Goal: Ability to identify changes in lifestyle to reduce recurrence of condition will improve Outcome: Progressing Goal: Identification of resources available to assist in meeting health care needs will improve Outcome: Progressing

## 2022-04-30 NOTE — [facility] Group Notes (Signed)
Spiritual care group on grief and loss facilitated by Chaplain Katy Herbie Lehrmann, [facility]  Group Goal: Support / Education around grief and loss  Members engage in facilitated group support and psycho-social education.  Group Description:  Following introductions and group rules, group members engaged in facilitated group dialogue and support around topic of loss, with particular support around experiences of loss in their lives. Group Identified types of loss (relationships / self / things) and identified patterns, circumstances, and changes that precipitate losses. Reflected on thoughts / feelings around loss, normalized grief responses, and recognized variety in grief experience. Group encouraged individual reflection on safe space and on the coping skills that they are already utilizing.  Group drew on Adlerian / Rogerian and narrative framework  Patient Progress: Did not attend.  

## 2022-04-30 NOTE — Progress Notes (Signed)
   04/30/22 0557  15 Minute Checks  Location Bedroom  Visual Appearance Calm  Behavior Sleeping  Sleep (Behavioral Health Patients Only)  Calculate sleep? (Click Yes once per 24 hr at 0600 safety check) Yes  Documented sleep last 24 hours 8.5

## 2022-04-30 NOTE — Progress Notes (Signed)
Pt denied SI/HI/AVH this morning. Pt rated her depression a 8/10, anxiety a 8/10. Pt complains of withdrawal symptoms today including chills, sweating, and body aches. PRN Tylenol provided to patient for generalized body aches. Pt has been cooperative throughout the shift. RN provided support and encouragement to patient. Pt given scheduled medications as prescribed. Q15 min checks verified for safety.  Patient verbally contracts for safety. Patient compliant with medications and treatment plan. Patient is interacting well on the unit. Pt is safe on the unit.   04/30/22 1100  Psych Admission Type (Psych Patients Only)  Admission Status Voluntary  Psychosocial Assessment  Patient Complaints Anxiety;Depression  Eye Contact Fair  Facial Expression Sad  Affect Sad;Flat  Speech Logical/coherent  Interaction Assertive  Motor Activity Other (Comment) (WDL)  Appearance/Hygiene Unremarkable  Behavior Characteristics Cooperative;Appropriate to situation  Mood Depressed;Sad  Thought Process  Coherency WDL  Content WDL  Delusions None reported or observed  Perception WDL  Hallucination None reported or observed  Judgment Impaired  Confusion None  Danger to Self  Current suicidal ideation? Denies  Agreement Not to Harm Self Yes  Description of Agreement Pt verbally contracts for safety  Danger to Others  Danger to Others None reported or observed

## 2022-04-30 NOTE — H&P (Signed)
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 discussed in multi-disciplinary team meeting  -- Observation Level : q15 minute checks  -- Vital signs:  q12 hours  -- Precautions: suicide, elopement, and assault  2. Psychiatric Diagnoses and Treatment:  #MDD severe, recurrent w/ suicidal ideation  --  The risks/benefits/side-effects/alternatives to this medication were discussed in detail with the patient and time was given for questions. The patient consents to medication trial.  -  -- Encouraged patient to participate in unit milieu and in scheduled group therapies   -- Short Term Goals:   -- Long Term Goals: Improvement in symptoms so as ready for discharge   3. Medical Issues Being Addressed:   #Tobacco Use Disorder -- Nicotine patch 21mg /24 hours ordered  -- Smoking cessation encouraged  #Labs Reviewed/Pending  -EKG ordered  4. Discharge Planning:  -- Social work and case management to assist with discharge planning and identification of hospital follow-up needs prior to discharge  -- Estimated LOS: 5-7 days  -- Discharge Concerns: Need to establish a safety plan; Medication compliance and effectiveness  -- Discharge Goals: Return home with outpatient referrals for mental health follow-up including medication management/psychotherapy  The patient is agreeable with the medication plan, as above. We will monitor the patient's response to pharmacologic treatment, and adjust medications as necessary. Patient is encouraged to participate in group therapy while admitted to the psychiatric unit. We will address other chronic and acute stressors, which contributed to the patient's mood symptoms and SI, in order to reduce the risk of self-harm at discharge.  I certify that inpatient services furnished can reasonably be expected to improve the patient's condition.     Total Time Spent in Direct Patient Care:  I personally spent 30 minutes on the unit in direct patient care. The direct patient care time included face-to-face time with the patient, reviewing the patient's chart, communicating with other professionals, and coordinating care. Greater than 50% of this time was spent in counseling or coordinating care with the patient regarding goals of hospitalization, psycho-education, and discharge planning needs.  I have independently evaluated the patient during a face-to-face assessment. I reviewed the patient's chart, and I participated in key portions of the service. I discussed the case with the [employer], and I agree with the assessment and plan of care as documented in the [facility] Officer's note, as addended by me or notated below if indicated: None  Chastidy Ranker  NUUV,OZ,DGU,YQIHKV Psychiatrist.    Rex Kras, MD 4/24/20244:58 PM

## 2022-04-30 NOTE — Group Note (Signed)
Date:  04/30/2022 Time:  2:34 PM  Group Topic/Focus:  Emotional Education:   The focus of this group is to discuss what feelings/emotions are, and how they are experienced.    Participation Level:  Did Not Attend  Participation Quality:    Affect:    Cognitive:    Insight:   Engagement in Group:    Modes of Intervention:    Additional Comments:    Jaquita Rector 04/30/2022, 2:34 PM

## 2022-04-30 NOTE — Progress Notes (Signed)
Patient stated her goal, "I just want to detox and get sober." Patient rated her anxiety 7/10, depression 8/10, and "broken ribs" 7/10. Patient received PRN Tylenol for pain, Hydroxyzine for anxiety, and Nicotine gum, no side effects reported. Patient received support and encouragement to participate in group activities. Patient denies SI,HI,& AVH. Q 15 minutes safety checks maintained.   04/29/22 2200  Psych Admission Type (Psych Patients Only)  Admission Status Voluntary  Psychosocial Assessment  Patient Complaints Anxiety;Depression;Insomnia;Substance abuse  Eye Contact Fair  Facial Expression Sad  Affect Flat  Speech Logical/coherent  Interaction Guarded;Minimal  Motor Activity Other (Comment)  Appearance/Hygiene Poor hygiene  Behavior Characteristics Cooperative;Appropriate to situation  Mood Depressed;Pleasant  Aggressive Behavior  Effect No apparent injury  Thought Process  Coherency WDL  Content WDL  Delusions None reported or observed  Perception WDL  Hallucination None reported or observed  Judgment Impaired  Confusion None  Danger to Self  Current suicidal ideation? Denies  Agreement Not to Harm Self Yes  Description of Agreement Verbal contract  Danger to Others  Danger to Others None reported or observed

## 2022-04-30 NOTE — Group Note (Signed)
Date:  04/30/2022 Time:  10:38 AM  Group Topic/Focus:  Goals Group:   The focus of this group is to help patients establish daily goals to achieve during treatment and discuss how the patient can incorporate goal setting into their daily lives to aide in recovery. Orientation:   The focus of this group is to educate the patient on the purpose and policies of crisis stabilization and provide a format to answer questions about their admission.  The group details unit policies and expectations of patients while admitted.    Participation Level:  Did Not Attend  Participation Quality:    Affect:    Cognitive:    Insight:   Engagement in Group:    Modes of Intervention:    Additional Comments:    Jaquita Rector 04/30/2022, 10:38 AM

## 2022-04-30 NOTE — BH IP Treatment Plan (Signed)
Interdisciplinary Treatment and Diagnostic Plan Update  04/30/2022 Time of Session: 10:50 AM Jenny Clark MRN: [id number]  Principal Diagnosis: Substance induced mood disorder  Secondary Diagnoses: Principal Problem:   Substance induced mood disorder   Current Medications:  Current Facility-Administered Medications  Medication Dose Route Frequency Provider Last Rate Last Admin   acetaminophen (TYLENOL) tablet 650 mg  650 mg Oral Q6H PRN Ardis Hughs, NP   650 mg at 04/30/22 0842   alum & mag hydroxide-simeth (MAALOX/MYLANTA) 200-200-20 MG/5ML suspension 30 mL  30 mL Oral Q4H PRN Ardis Hughs, NP       diphenhydrAMINE (BENADRYL) capsule 50 mg  50 mg Oral TID PRN Ardis Hughs, NP       Or   diphenhydrAMINE (BENADRYL) injection 50 mg  50 mg Intramuscular TID PRN Ardis Hughs, NP       FLUoxetine (PROZAC) capsule 20 mg  20 mg Oral Daily Vernard Gambles H, NP   20 mg at 04/30/22 0827   haloperidol (HALDOL) tablet 5 mg  5 mg Oral TID PRN Ardis Hughs, NP       Or   haloperidol lactate (HALDOL) injection 5 mg  5 mg Intramuscular TID PRN Ardis Hughs, NP       hydrOXYzine (ATARAX) tablet 25 mg  25 mg Oral TID PRN Ardis Hughs, NP   25 mg at 04/29/22 2138   lamoTRIgine (LAMICTAL) tablet 25 mg  25 mg Oral Daily Ardis Hughs, NP   25 mg at 04/30/22 0827   LORazepam (ATIVAN) tablet 2 mg  2 mg Oral TID PRN Ardis Hughs, NP       Or   LORazepam (ATIVAN) injection 2 mg  2 mg Intramuscular TID PRN Ardis Hughs, NP       magnesium hydroxide (MILK OF MAGNESIA) suspension 30 mL  30 mL Oral Daily PRN Ardis Hughs, NP       nicotine polacrilex (NICORETTE) gum 2 mg  2 mg Oral PRN Massengill, Harrold Donath, MD   2 mg at 04/30/22 1438   nitrofurantoin (macrocrystal-monohydrate) (MACROBID) capsule 100 mg  100 mg Oral Q12H Ardis Hughs, NP   100 mg at 04/30/22 0827   PTA Medications: Medications Prior to Admission  Medication Sig Dispense  Refill Last Dose   FLUoxetine (PROZAC) 20 MG capsule Take 1 capsule (20 mg total) by mouth daily.  3    hydrOXYzine (ATARAX) 25 MG tablet Take 1 tablet (25 mg total) by mouth 3 (three) times daily as needed for anxiety. (Patient not taking: Reported on 04/29/2022) 90 tablet 0    lamoTRIgine (LAMICTAL) 25 MG tablet Take 1 tablet (25 mg total) by mouth daily.      propranolol (INDERAL) 20 MG tablet Take 20 mg by mouth 2 (two) times daily. (Patient not taking: Reported on 04/29/2022)       Patient Stressors: Financial difficulties   Marital or family conflict   Medication change or noncompliance   Substance abuse    Patient Strengths: Forensic psychologist fund of knowledge   Treatment Modalities: Medication Management, Group therapy, Case management,  1 to 1 session with clinician, Psychoeducation, Recreational therapy.   Physician Treatment Plan for Primary Diagnosis: Substance induced mood disorder Long Term Goal(s):     Short Term Goals:    Medication Management: Evaluate patient's response, side effects, and tolerance of medication regimen.  Therapeutic Interventions: 1 to 1 sessions, Unit Group sessions and Medication administration.  Evaluation  of Outcomes: Not Progressing  Physician Treatment Plan for Secondary Diagnosis: Principal Problem:   Substance induced mood disorder  Long Term Goal(s):     Short Term Goals:       Medication Management: Evaluate patient's response, side effects, and tolerance of medication regimen.  Therapeutic Interventions: 1 to 1 sessions, Unit Group sessions and Medication administration.  Evaluation of Outcomes: Not Progressing   RN Treatment Plan for Primary Diagnosis: Substance induced mood disorder Long Term Goal(s): Knowledge of disease and therapeutic regimen to maintain health will improve  Short Term Goals: Ability to remain free from injury will improve, Ability to verbalize frustration and anger appropriately will  improve, Ability to demonstrate self-control, Ability to participate in decision making will improve, Ability to verbalize feelings will improve, Ability to disclose and discuss suicidal ideas, Ability to identify and develop effective coping behaviors will improve, and Compliance with prescribed medications will improve  Medication Management: RN will administer medications as ordered by provider, will assess and evaluate patient's response and provide education to patient for prescribed medication. RN will report any adverse and/or side effects to prescribing provider.  Therapeutic Interventions: 1 on 1 counseling sessions, Psychoeducation, Medication administration, Evaluate responses to treatment, Monitor vital signs and CBGs as ordered, Perform/monitor CIWA, COWS, AIMS and Fall Risk screenings as ordered, Perform wound care treatments as ordered.  Evaluation of Outcomes: Not Progressing   LCSW Treatment Plan for Primary Diagnosis: Substance induced mood disorder Long Term Goal(s): Safe transition to appropriate next level of care at discharge, Engage patient in therapeutic group addressing interpersonal concerns.  Short Term Goals: Engage patient in aftercare planning with referrals and resources, Increase social support, Increase ability to appropriately verbalize feelings, Increase emotional regulation, Facilitate acceptance of mental health diagnosis and concerns, Facilitate patient progression through stages of change regarding substance use diagnoses and concerns, Identify triggers associated with mental health/substance abuse issues, and Increase skills for wellness and recovery  Therapeutic Interventions: Assess for all discharge needs, 1 to 1 time with Social worker, Explore available resources and support systems, Assess for adequacy in community support network, Educate family and significant other(s) on suicide prevention, Complete Psychosocial Assessment, Interpersonal group  therapy.  Evaluation of Outcomes: Not Progressing   Progress in Treatment: Attending groups: No. Participating in groups: No. Taking medication as prescribed: Yes. Toleration medication: Yes. Family/Significant other contact made: No, will contact:  patient declined Patient understands diagnosis: Yes. Discussing patient identified problems/goals with staff: Yes. Medical problems stabilized or resolved: Yes. Denies suicidal/homicidal ideation: Yes. Issues/concerns per patient self-inventory: No.   New problem(s) identified: No, Describe:  None reported   New Short Term/Long Term Goal(s):medication stabilization, elimination of SI thoughts, development of comprehensive mental wellness plan.    Patient Goals:  " Get Sober , I want my life back "   Discharge Plan or Barriers: Patient recently admitted. CSW will continue to follow and assess for appropriate referrals and possible discharge planning.    Reason for Continuation of Hospitalization: Anxiety Depression Medication stabilization Suicidal ideation  Estimated Length of Stay: 3-5 days   Last 3 [place] Suicide Severity Risk Score: Flowsheet Row Ad[facility] (Current) [facility] from [facility]/20[facility] from 04/17/2022 in [facility]  C-SSRS RISK CATEGORY High Risk No Risk No Risk       Last PHQ 2/9 Scores:    01/04/2022   12:56 PM 09/12/2021    1:54 PM 04/30/2018  11:46 AM  Depression screen PHQ 2/9  Decreased Interest Down, Depressed, Hopeless PHQ - 2 Score Altered sleeping Tired, decreased energy Change in appetite 3 2 0  Feeling bad or failure about yourself  3 3 0  Trouble concentrating Moving slowly or fidgety/restless 1 0 0  Suicidal thoughts 2 1 0  PHQ-9 Score Difficult doing work/chores Very difficult  Somewhat difficult    Scribe  for Treatment Team: Beather Arbour 04/30/2022 4:07 PM

## 2022-04-30 NOTE — Progress Notes (Signed)
   04/30/22 2030  Psych Admission Type (Psych Patients Only)  Admission Status Voluntary  Psychosocial Assessment  Patient Complaints None  Eye Contact Fair  Facial Expression Flat  Affect Appropriate to circumstance  Speech Logical/coherent  Interaction Assertive  Motor Activity Other (Comment)  Appearance/Hygiene Unremarkable  Behavior Characteristics Cooperative;Appropriate to situation  Mood Apprehensive  Thought Process  Coherency WDL  Content WDL  Delusions None reported or observed  Perception WDL  Hallucination None reported or observed  Judgment Poor  Confusion None  Danger to Self  Current suicidal ideation? Denies  Agreement Not to Harm Self Yes  Description of Agreement Pt. verbally  Danger to Others  Danger to Others None reported or observed

## 2022-05-01 NOTE — Plan of Care (Signed)
Nurse discussed coping skills with patient.  

## 2022-05-01 NOTE — Group Note (Signed)
Occupational Therapy Group Note  Group Topic:Coping Skills  Group Date: 05/01/2022 Start Time: 1430 End Time: 1500 Facilitators: Ted Mcalpine, OT   Group Description: Group encouraged increased engagement and participation through discussion and activity focused on "Coping Ahead." Patients were split up into teams and selected a card from a stack of positive coping strategies. Patients were instructed to act out/charade the coping skill for other peers to guess and receive points for their team. Discussion followed with a focus on identifying additional positive coping strategies and patients shared how they were going to cope ahead over the weekend while continuing hospitalization stay.  Therapeutic Goal(s): Identify positive vs negative coping strategies. Identify coping skills to be used during hospitalization vs coping skills outside of hospital/at home Increase participation in therapeutic group environment and promote engagement in treatment   Participation Level: Engaged   Participation Quality: Independent   Behavior: Appropriate   Speech/Thought Process: Relevant   Affect/Mood: Appropriate   Insight: Fair   Judgement: Fair      Modes of Intervention: Education  Patient Response to Interventions:  Attentive   Plan: Continue to engage patient in OT groups 2 - 3x/week.  05/01/2022  Ted Mcalpine, OT  Kerrin Champagne, OT
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
## 2022-05-01 NOTE — Progress Notes (Signed)
D:  Patient's self inventory sheet, patient has poor sleep, no sleep medication.  Poor appetite, low energy level.  Rated depression 8, hopeless 7, anxiety 8.  Withdrawals, chilling, cravings, agitation, nausea, runny nose.   Denied SI  Physical problems, lightheaded, dizzy.  Painful rib area, all over pain, worst pain #7 in past 24 hours.  Plans to rest today.  No discharge plans. A:  Medications administered per MD orders.  Emotional support and encouragement given patient. R:  Denied SI and HI, contracts for safety.  Denied A/V hallucinations.   Safety maintained with 15 minute checks.
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
## 2022-05-01 NOTE — BHH Group Notes (Signed)
B[facility] Group Notes:  (Nursing/MHT/Case Management/Adjunct)  Date:  05/01/2022  Time:  2000  Type of Therapy:   wrap up group  Participation Level:  Active  Participation Quality:  Appropriate, Attentive, Sharing, and Supportive  Affect:  Appropriate  Cognitive:  Alert  Insight:  Improving  Engagement in Group:  Engaged  Modes of Intervention:  Clarification, Education, and Support  Summary of Progress/Problems: Positive thinking and positive change were discussed.   Jenny Clark S 05/01/2022, 10:12 PM

## 2022-05-02 MED ORDER — PROPRANOLOL HCL 10 MG PO TABS
10.0000 mg | ORAL_TABLET | Freq: Two times a day (BID) | ORAL | Status: DC
  Administered 2022-05-02 – 2022-05-06 (×9): 10 mg via ORAL
  Filled 2022-05-02 (×12): qty 1

## 2022-05-02 NOTE — [facility] Suicide Risk Assessment (Signed)
[facility] INPATIENT:  Family/Significant Other Suicide Prevention Education  Suicide Prevention Education:  Education Completed; 05-02-2022, [name] mom [phone] has been identified by the patient as the family member/significant other with whom the patient will be residing, and identified as the person(s) who will aid the patient in the event of a mental health crisis (suicidal ideations/suicide attempt).  With written consent from the patient, the [name] mom [phone] has been provided the following suicide prevention education, prior to the and/or following the discharge of the patient.  The suicide prevention education provided includes the following: Suicide risk factors Suicide prevention and interventions National Suicide Hotline telephone number [facility] assessment telephone number [facility] Emergency Assistance 911 [facility] and/or Residential Mobile Crisis Unit telephone number  Request made of family/significant other to: Remove weapons (e.g., guns, rifles, knives), all items previously/currently identified as safety concern.   Remove drugs/medications (over-the-counter, prescriptions, illicit drugs), all items previously/currently identified as a safety concern.  [name] mom [phone] verbalizes understanding of the suicide prevention education information provided.  The family member/significant other agrees to remove the items of safety concern listed above.  Edis Huish S Abenezer Odonell 05/02/2022, 3:38 PM

## 2022-05-02 NOTE — Group Note (Signed)
Date:  05/02/2022 Time:  5:24 PM  Group Topic/Focus:  Spirituality:   The focus of this group is to discuss how one's spirituality can aide in recovery.    Participation Level:  Active  Participation Quality:  Attentive  Affect:  Appropriate  Cognitive:  Appropriate  Insight: Good  Engagement in Group:  Engaged  Modes of Intervention:      Additional Comments:    Estill Dooms 05/02/2022, 5:24 PM

## 2022-05-02 NOTE — Plan of Care (Signed)
Nurse discussed coping skills with patient.  

## 2022-05-02 NOTE — Progress Notes (Signed)
D;  Patient's self inventory sheet, patient has poor sleep.  Fair appetite, low energy level, poor concentration .  Rated depression 4, hopeless 3, anxiety 2.  Denied withdrawals.  Denied SI.  Physical problems, lightheaded, pain.    Goal is eat full meal.  Go to dining room.  Does have discharge plans. A:  Medications administered per MD orders.  Emotional support and encouragement given patient. R:  Denied SI and HI.  Denied A/V hallucinations.  Safety maintained with 15 minute checks.

## 2022-05-02 NOTE — Group Note (Signed)
Date:  05/02/2022 Time:  2:02 PM  Group Topic/Focus:  Emotional Education:   The focus of this group is to discuss what feelings/emotions are, and how they are experienced art doing art to express themselves.    Participation Level:  Active  Participation Quality:  Appropriate  Affect:  Appropriate  Cognitive:  Appropriate  Insight: Good  Engagement in Group:  Engaged  Modes of Intervention:      Additional Comments:    Estill Dooms 05/02/2022, 2:02 PM
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
## 2022-05-02 NOTE — [facility] Group Notes (Signed)
Adult Psychoeducational Group Note  Date:  05/02/2022 Time:  10:20 PM  Group Topic/Focus:  Wrap-Up Group:  Alcohol Anonymous Group   Participation Level:  Active  Participation Quality:  Appropriate  Affect:  Appropriate  Cognitive:  Appropriate  Insight: Appropriate  Engagement in Group:    Modes of Intervention:  Discussion  Additional Comments:  Pt attended AA group 

## 2022-05-02 NOTE — [facility] Group Notes (Signed)
Adult Psychoeducational Group Note  Date:  05/02/2022 Time:  4:15 PM  Group Topic/Focus:  Goals Group:   The focus of this group is to help patients establish daily goals to achieve during treatment and discuss how the patient can incorporate goal setting into their daily lives to aide in recovery.  Participation Level:  Did Not Attend   Additional Comments:  Pt was encouraged to come to group, but did not attend.   Burlene Arnt 05/02/2022, 4:15 PM

## 2022-05-02 NOTE — Progress Notes (Signed)
   05/01/22 2050  Psych Admission Type (Psych Patients Only)  Admission Status Voluntary  Psychosocial Assessment  Patient Complaints Anxiety;Depression  Eye Contact Fair  Facial Expression Flat  Affect Appropriate to circumstance  Speech Logical/coherent  Interaction Assertive  Motor Activity Other (Comment) (WDL)  Appearance/Hygiene Unremarkable  Behavior Characteristics Cooperative;Appropriate to situation  Mood Pleasant  Thought Process  Coherency WDL  Content WDL  Delusions None reported or observed  Perception WDL  Hallucination None reported or observed  Judgment Poor  Confusion None  Danger to Self  Current suicidal ideation? Denies  Agreement Not to Harm Self Yes  Description of Agreement Verbally contracts for safety.  Danger to Others  Danger to Others None reported or observed   Patient alert and oriented. Presenting appropriate to circumstance with a pleasant mood. Patient denies SI, HI, and AVH. Patient reports anxiety and depression rated as 5/10, stating, " It's not bad, I feel a lot better today". Patient reports pain rated 8/10 on the left side of her rib cage. PRN ibuprofen administered, per provider orders. PRN hydroxyzine administered, per provider orders. Scheduled medications administered to patient, per provider orders. Support and encouragement provided. Routine safety checks conducted every 15 minutes. Patient remains safe on the unit.

## 2022-05-03 MED ORDER — QUETIAPINE FUMARATE 25 MG PO TABS
25.0000 mg | ORAL_TABLET | Freq: Every day | ORAL | Status: DC
  Administered 2022-05-03 – 2022-05-05 (×3): 25 mg via ORAL
  Filled 2022-05-03 (×4): qty 1

## 2022-05-03 NOTE — Progress Notes (Signed)
  Pt found sited in the day room calm, alert and oriented x 4, patient is participating appropriately with staff and peers in the milieu, with no acute distress. Pt denies having Suicidal thoughts at this time. Pt also denies auditory and visual hallucination, rated anxiety and depression at a 6/10. Pt attended group activities this evening. Pt was compliant with meds and snacks. Q 15 minutes safety checks in progress, will continue to monitor and follow plan of care as ordered.     05/02/22 2010  Psych Admission Type (Psych Patients Only)  Admission Status Voluntary  Psychosocial Assessment  Patient Complaints Anxiety  Eye Contact Fair  Facial Expression Flat  Affect Appropriate to circumstance  Speech Logical/coherent  Interaction Assertive  Motor Activity Slow  Appearance/Hygiene Unremarkable  Behavior Characteristics Cooperative;Appropriate to situation  Mood Pleasant  Thought Process  Coherency WDL  Content WDL  Delusions WDL  Perception WDL  Hallucination None reported or observed  Judgment Poor  Confusion None  Danger to Self  Current suicidal ideation? Denies  Agreement Not to Harm Self Yes  Description of Agreement verbally contratcted for safety  Danger to Others  Danger to Others None reported or observed

## 2022-05-03 NOTE — Group Note (Signed)
Date:  05/03/2022 Time:  6:19 PM  Group Topic/Focus:  Social Wellness (Self and Interpersonal)    Participation Level:  Active  Participation Quality:  Appropriate  Affect:  Appropriate  Cognitive:  Appropriate  Insight: Appropriate  Engagement in Group:  Engaged  Modes of Intervention:  Activity  Additional Comments:   Pt attended and participated in the Social Wellness group.  Edmund Hilda Edmundo Tedesco 05/03/2022, 6:19 PM

## 2022-05-03 NOTE — Progress Notes (Signed)
   05/03/22 2113  Psych Admission Type (Psych Patients Only)  Admission Status Voluntary  Psychosocial Assessment  Patient Complaints Sadness  Eye Contact Fair  Facial Expression Flat  Affect Appropriate to circumstance  Speech Logical/coherent  Interaction Assertive  Motor Activity Other (Comment) (unremarkable)  Appearance/Hygiene Unremarkable  Behavior Characteristics Appropriate to situation;Cooperative  Mood Pleasant  Thought Process  Coherency WDL  Content WDL  Delusions None reported or observed  Perception WDL  Hallucination None reported or observed  Judgment Poor  Confusion None  Danger to Self  Current suicidal ideation? Denies  Agreement Not to Harm Self Yes  Description of Agreement verbal  Danger to Others  Danger to Others None reported or observed   Pt calm, cooperative. Verbalizes sadness r/t missing her daughter's 1st birthday, and mentions plan to attend rehab post discharge. Pt denies SI/HI/AVH.

## 2022-05-03 NOTE — Progress Notes (Signed)
   05/03/22 1100  Psych Admission Type (Psych Patients Only)  Admission Status Voluntary  Psychosocial Assessment  Patient Complaints Anxiety  Eye Contact Fair  Facial Expression Flat  Affect Appropriate to circumstance  Speech Logical/coherent  Interaction Assertive  Motor Activity Other (Comment) (steady gait)  Appearance/Hygiene Unremarkable  Behavior Characteristics Cooperative;Appropriate to situation  Mood Pleasant  Thought Process  Coherency WDL  Content WDL  Delusions WDL  Perception WDL  Hallucination None reported or observed  Judgment Poor  Confusion None  Danger to Self  Current suicidal ideation? Denies  Agreement Not to Harm Self Yes  Description of Agreement agrees to contact staff before acting on harmful thoughts  Danger to Others  Danger to Others Reported or observed

## 2022-05-03 NOTE — Progress Notes (Signed)
Writer explain to [place] there is nothing therapeutic pertaining to this movie Knock up. Writer asked what else do you want to look at nooone said anything Clinical research associate turn to basketbal gam.Javier said I am not looking at this and left the day room.

## 2022-05-03 NOTE — Group Note (Signed)
Date:  05/03/2022 Time:  5:00 PM  Group Topic/Focus:  Intellectual Wellness    Participation Level:  Did Not Attend  Participation Quality:   n/a  Affect:   n/a  Cognitive:   n/a  Insight: None  Engagement in Group:   n/a  Modes of Intervention:   n/a  Additional Comments:   Pt did not attend.  Edmund Hilda Airlie Blumenberg 05/03/2022, 5:00 PM

## 2022-05-03 NOTE — Group Note (Signed)
Date:  05/03/2022 Time:  6:50 PM  Group Topic/Focus:  Emotional Wellness    Participation Level:  Did Not Attend  Participation Quality:   n/a  Affect:   n/a  Cognitive:   n/a  Insight: None  Engagement in Group:   n/a  Modes of Intervention:   n/a  Additional Comments:   Pt did not attend.  Edmund Hilda Dayln Tugwell 05/03/2022, 6:50 PM

## 2022-05-03 NOTE — Group Note (Signed)
Date:  05/03/2022 Time:  10:18 AM  Group Topic/Focus:  Goals Group:   The focus of this group is to help patients establish daily goals to achieve during treatment and discuss how the patient can incorporate goal setting into their daily lives to aide in recovery. Orientation:   The focus of this group is to educate the patient on the purpose and policies of crisis stabilization and provide a format to answer questions about their admission.  The group details unit policies and expectations of patients while admitted.    Participation Level:  Did Not Attend  Participation Quality:   n/a  Affect:   n/a  Cognitive:   n/a  Insight: None  Engagement in Group:   n/a  Modes of Intervention:   n/a  Additional Comments:   Pt did not attend.  Jenny Clark 05/03/2022, 10:18 AM
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
## 2022-05-04 NOTE — BHH Group Notes (Signed)
Group Topic/Focus:  Emotional Education:   The focus of this group is to discuss what feelings/emotions are, and how they are experienced.  Participation Level:  Active  Participation Quality:  Appropriate  Affect:  Appropriate  Cognitive:  Appropriate  Insight: Appropriate  Engagement in Group:  Engaged  Modes of Intervention:  Discussion  Additional Comments:  Pt participated in emotional wellness group. Pt was able to practice guided mediation to promote emotional wellness.  

## 2022-05-04 NOTE — BHH Group Notes (Signed)
Adult Psychoeducational Group Note  Date:  05/04/2022 Time:  8:30 PM  Group Topic/Focus:  Wrap-Up Group:   The focus of this group is to help patients review their daily goal of treatment and discuss progress on daily workbooks.  Participation Level:  Active  Participation Quality:  Appropriate  Affect:  Appropriate  Cognitive:  Appropriate  Insight: Appropriate  Engagement in Group:  Engaged  Modes of Intervention:  Discussion  Additional Comments:  Pt attended wrap-up group. Pt was able to rate their day, describe something positive today and set goal for tomorrow.   

## 2022-05-04 NOTE — BHH Group Notes (Signed)
BHH Group Notes:  (Nursing/MHT/Case Management/Adjunct)  Date:  05/04/2022  Time:  9:04 AM  Type of Therapy:   Goals group  Participation Level:  Active  Participation Quality:  Appropriate  Affect:  Appropriate  Cognitive:  Appropriate  Insight:  Appropriate  Engagement in Group:  Engaged  Modes of Intervention:  Discussion and Orientation  Summary of Progress/Problems: Goal for the day is to stay positive  Azalee Course 05/04/2022, 9:04 AM

## 2022-05-04 NOTE — Progress Notes (Signed)
   05/04/22 2102  Psych Admission Type (Psych Patients Only)  Admission Status Voluntary  Psychosocial Assessment  Patient Complaints None  Eye Contact Fair  Facial Expression Other (Comment) (unremarkable)  Affect Appropriate to circumstance  Speech Logical/coherent  Interaction Assertive  Motor Activity Other (Comment) (unremarkable)  Appearance/Hygiene Unremarkable  Behavior Characteristics Cooperative;Appropriate to situation  Mood Pleasant  Thought Process  Content WDL  Delusions None reported or observed  Perception WDL  Hallucination None reported or observed  Judgment Poor  Confusion None  Danger to Self  Current suicidal ideation? Denies  Agreement Not to Harm Self Yes  Description of Agreement verbal  Danger to Others  Danger to Others None reported or observed  Danger to Others Abnormal  Harmful Behavior to others No threats or harm toward other people   Pt is calm, cooperative and pleasant. No issues reported.

## 2022-05-04 NOTE — Progress Notes (Signed)
Adult Psychoeducational Group Note  Date:  05/04/2022 Time:  4:38 AM  Group Topic/Focus:  Wrap-Up Group:   The focus of this group is to help patients review their daily goal of treatment and discuss progress on daily workbooks.  Participation Level:  Active  Participation Quality:  Appropriate  Affect:  Appropriate  Cognitive:  Appropriate  Insight: Appropriate  Engagement in Group:  Engaged  Modes of Intervention:  Discussion  Additional Comments:    Chauncey Fischer 05/04/2022, 4:38 AM

## 2022-05-04 NOTE — Progress Notes (Addendum)
D. Pt presents with a flat affect - brightens during interactions- observed on the unit interacting well with peers and attending groups. Per pt's self inventory, pt rated her depression,hopelessness and anxiety an 8/5/4, respectively. Pt's stated goal today was to "work on staying positive- today is my daughter's first birthday." Pt currently denies SI/HI and AVH  A. Labs and vitals monitored. Pt given and educated on medications. PRN meds given for continued rib pain. Pt supported emotionally and encouraged to express concerns and ask questions.   R. Pt remains safe with 15 minute checks. Will continue POC.    05/04/22 1300  Psych Admission Type (Psych Patients Only)  Admission Status Voluntary  Psychosocial Assessment  Patient Complaints Anxiety  Eye Contact Fair  Facial Expression Flat  Affect Appropriate to circumstance  Speech Logical/coherent  Interaction Assertive  Motor Activity Other (Comment) (steady gait)  Appearance/Hygiene Unremarkable  Behavior Characteristics Cooperative;Appropriate to situation  Mood Pleasant  Thought Process  Coherency WDL  Content WDL  Delusions None reported or observed  Perception WDL  Hallucination None reported or observed  Judgment Poor  Confusion None  Danger to Self  Current suicidal ideation? Denies  Agreement Not to Harm Self Yes  Description of Agreement agrees to contact staff before acting on harmful thoughts  Danger to Others  Danger to Others None reported or observed  Danger to Others Abnormal  Harmful Behavior to others No threats or harm toward other people

## 2022-05-05 ENCOUNTER — Encounter (HOSPITAL_COMMUNITY): Payer: Self-pay

## 2022-05-05 NOTE — Group Note (Signed)
Occupational Therapy Group Note  Group Topic:Coping Skills  Group Date: 05/05/2022 Start Time: 1430 End Time: 1500 Facilitators: Ted Mcalpine, OT   Group Description: Group encouraged increased engagement and participation through discussion and activity focused on "Coping Ahead." Patients were split up into teams and selected a card from a stack of positive coping strategies. Patients were instructed to act out/charade the coping skill for other peers to guess and receive points for their team. Discussion followed with a focus on identifying additional positive coping strategies and patients shared how they were going to cope ahead over the weekend while continuing hospitalization stay.  Therapeutic Goal(s): Identify positive vs negative coping strategies. Identify coping skills to be used during hospitalization vs coping skills outside of hospital/at home Increase participation in therapeutic group environment and promote engagement in treatment   Participation Level: Engaged   Participation Quality: Independent   Behavior: Appropriate   Speech/Thought Process: Relevant   Affect/Mood: Appropriate   Insight: Fair   Judgement: Fair      Modes of Intervention: Education  Patient Response to Interventions:  Engaged   Plan: Continue to engage patient in OT groups 2 - 3x/week.  05/05/2022  Ted Mcalpine, OT Kerrin Champagne, OT

## 2022-05-05 NOTE — BHH Group Notes (Signed)
Patient attended the AA group. 

## 2022-05-05 NOTE — Group Note (Unsigned)
Date:  05/05/2022 Time:  4:29 PM  Group Topic/Focus:  Developing a Wellness Toolbox:   The focus of this group is to help patients develop a "wellness toolbox" with skills and strategies to promote recovery upon discharge.     Participation Level:  {BHH PARTICIPATION LEVEL:22264}  Participation Quality:  {BHH PARTICIPATION QUALITY:22265}  Affect:  {BHH AFFECT:22266}  Cognitive:  {BHH COGNITIVE:22267}  Insight: {BHH Insight2:20797}  Engagement in Group:  {BHH ENGAGEMENT IN GROUP:22268}  Modes of Intervention:  {BHH MODES OF INTERVENTION:22269}  Additional Comments:  ***  Aimar Shrewsbury G Cordney Barstow 05/05/2022, 4:29 PM  

## 2022-05-05 NOTE — Progress Notes (Signed)
   05/05/22 2014  Psych Admission Type (Psych Patients Only)  Admission Status Voluntary  Psychosocial Assessment  Patient Complaints Anger  Eye Contact Fair  Facial Expression Animated  Affect Angry  Speech Logical/coherent  Interaction Assertive  Motor Activity Other (Comment) (unremarkable)  Appearance/Hygiene Unremarkable  Behavior Characteristics Cooperative  Mood Angry  Thought Process  Coherency WDL  Content WDL  Delusions None reported or observed  Perception WDL  Hallucination None reported or observed  Judgment Poor  Confusion None  Danger to Self  Current suicidal ideation? Denies  Agreement Not to Harm Self Yes  Description of Agreement verbal  Danger to Others  Danger to Others None reported or observed  Danger to Others Abnormal  Harmful Behavior to others No threats or harm toward other people   Upon assessment, pt cooperative and compliant but expresses anger and frustration r/t discharge plans tomorrow and feeling like plans are not adequate. Pt denies SI/HI/AVH.

## 2022-05-05 NOTE — [facility] Group Notes (Signed)
Adult Psychoeducational Group Note  Date:  05/05/2022 Time:  10:57 AM  Group Topic/Focus:  Goals Group:   The focus of this group is to help patients establish daily goals to achieve during treatment and discuss how the patient can incorporate goal setting into their daily lives to aide in recovery. Orientation:   The focus of this group is to educate the patient on the purpose and policies of crisis stabilization and provide a format to answer questions about their admission.  The group details unit policies and expectations of patients while admitted.  Participation Level:  Active  Participation Quality:  Appropriate  Affect:  Appropriate  Cognitive:  Appropriate  Insight: Appropriate  Engagement in Group:  Engaged  Modes of Intervention:  Discussion  Additional Comments:  Pt attended the orientation/goals group and remained appropriate and engaged throughout the duration of the group.   Sheran Lawless 05/05/2022, 10:57 AM

## 2022-05-05 NOTE — Progress Notes (Signed)
D:  Patient's self inventory sheet, patient sleeps good, sleep medication helpful.  Good appetite, normal energy level, good concentration.  Rated depression 3, anxiety 1, denied hopeless.  Withdrawals denied.  Denied SI.  Physical problems, pain L side worst pain #8, L ribs, med is helpful.  Goal is definitive date for discharge.  Does have discharge plans.  Will talk to family.   A:  Medications administered per MD orders.  Emotional support and encouragement given patient. R:  Patient denied SI and HI, contracts for safety.  Denied A/V hallucinations.  Safety maintained with 15 minute checks.

## 2022-05-05 NOTE — Progress Notes (Addendum)
Spiritual Resources group surrounding sources of strength facilitated by Chaplain Gayla Medicus  Group Goal: Support / Education around Scientist, product/process development  Members engage in facilitated group support and psycho-social education.  Group Description:  Following introductions and group rules, group members engaged in facilitated group dialogue and support around the topic of strength, with particular support around experiences of not having strength and/or having strength in their lives. Group Identified types of strength that motivate them and identified patterns, circumstances, and changes that included identifying their strength characteristics. Chaplain facilitated group discussion and supported group members contributions and support of one another. Group members were encouraged to individually reflect on their strengths and how they could use support systems to assist in having more strength.   Patient Progress: Jenny Clark attended group and actively engaged and participated in the group conversation and activities.  She offered empathy and support to her peers when the other peers were struggling. Her comments demonstrated good insight and self reflection on the topic.   Participation Level:  Active Participation Quality:  Appropriate Insight: Appropriate Engagement in Group:  Engaged Modes of Intervention:  Discussion and Worksheets  Animator

## 2022-05-05 NOTE — BH IP Treatment Plan (Signed)
Interdisciplinary Treatment and Diagnostic Plan Update  05/05/2022 Time of Session: 8:30am, update Jenny Clark MRN: [id number]  Principal Diagnosis: MDD (major depressive disorder), recurrent severe, without psychosis (HCC)  Secondary Diagnoses: Principal Problem:   MDD (major depressive disorder), recurrent severe, without psychosis (HCC) Active Problems:   Low TSH level   Polysubstance abuse (HCC)   PTSD (post-traumatic stress disorder)   Substance induced mood disorder (HCC)   Heroin dependence (HCC)   Current Medications:  Current Facility-Administered Medications  Medication Dose Route Frequency Provider Last Rate Last Admin   acetaminophen (TYLENOL) tablet 650 mg  650 mg Oral Q6H PRN Ardis Hughs, NP   650 mg at 05/04/22 2102   alum & mag hydroxide-simeth (MAALOX/MYLANTA) 200-200-20 MG/5ML suspension 30 mL  30 mL Oral Q4H PRN Ardis Hughs, NP       diphenhydrAMINE (BENADRYL) capsule 50 mg  50 mg Oral TID PRN Ardis Hughs, NP       Or   diphenhydrAMINE (BENADRYL) injection 50 mg  50 mg Intramuscular TID PRN Ardis Hughs, NP       FLUoxetine (PROZAC) capsule 20 mg  20 mg Oral Daily Vernard Gambles H, NP   20 mg at 05/05/22 7829   haloperidol (HALDOL) tablet 5 mg  5 mg Oral TID PRN Ardis Hughs, NP       Or   haloperidol lactate (HALDOL) injection 5 mg  5 mg Intramuscular TID PRN Ardis Hughs, NP       hydrOXYzine (ATARAX) tablet 25 mg  25 mg Oral TID PRN Ardis Hughs, NP   25 mg at 05/02/22 2132   ibuprofen (ADVIL) tablet 400 mg  400 mg Oral Q6H PRN Rex Kras, MD   400 mg at 05/05/22 5621   lamoTRIgine (LAMICTAL) tablet 25 mg  25 mg Oral Daily Ardis Hughs, NP   25 mg at 05/05/22 3086   magnesium hydroxide (MILK OF MAGNESIA) suspension 30 mL  30 mL Oral Daily PRN Ardis Hughs, NP       nicotine polacrilex (NICORETTE) gum 2 mg  2 mg Oral PRN Massengill, Harrold Donath, MD   2 mg at 04/30/22 1702   propranolol (INDERAL)  tablet 10 mg  10 mg Oral BID Rex Kras, MD   10 mg at 05/05/22 5784   QUEtiapine (SEROQUEL) tablet 25 mg  25 mg Oral QHS Rex Kras, MD   25 mg at 05/04/22 2102   PTA Medications: Medications Prior to Admission  Medication Sig Dispense Refill Last Dose   FLUoxetine (PROZAC) 20 MG capsule Take 1 capsule (20 mg total) by mouth daily.  3    hydrOXYzine (ATARAX) 25 MG tablet Take 1 tablet (25 mg total) by mouth 3 (three) times daily as needed for anxiety. (Patient not taking: Reported on 04/29/2022) 90 tablet 0    lamoTRIgine (LAMICTAL) 25 MG tablet Take 1 tablet (25 mg total) by mouth daily.      propranolol (INDERAL) 20 MG tablet Take 20 mg by mouth 2 (two) times daily. (Patient not taking: Reported on 04/29/2022)       Patient Stressors: Financial difficulties   Marital or family conflict   Medication change or noncompliance   Substance abuse    Patient Strengths: Forensic psychologist fund of knowledge   Treatment Modalities: Medication Management, Group therapy, Case management,  1 to 1 session with clinician, Psychoeducation, Recreational therapy.   Physician Treatment Plan for Primary Diagnosis: MDD (major depressive disorder), recurrent severe,  without psychosis (HCC) Long Term Goal(s): Improvement in symptoms so as ready for discharge   Short Term Goals: Ability to identify changes in lifestyle to reduce recurrence of condition will improve Ability to verbalize feelings will improve Ability to disclose and discuss suicidal ideas Ability to demonstrate self-control will improve Ability to identify and develop effective coping behaviors will improve Ability to maintain clinical measurements within normal limits will improve Compliance with prescribed medications will improve Ability to identify triggers associated with substance abuse/mental health issues will improve  Medication Management: Evaluate patient's response, side effects, and tolerance of  medication regimen.  Therapeutic Interventions: 1 to 1 sessions, Unit Group sessions and Medication administration.  Evaluation of Outcomes: Adequate for Discharge  Physician Treatment Plan for Secondary Diagnosis: Principal Problem:   MDD (major depressive disorder), recurrent severe, without psychosis (HCC) Active Problems:   Low TSH level   Polysubstance abuse (HCC)   PTSD (post-traumatic stress disorder)   Substance induced mood disorder (HCC)   Heroin dependence (HCC)  Long Term Goal(s): Improvement in symptoms so as ready for discharge   Short Term Goals: Ability to identify changes in lifestyle to reduce recurrence of condition will improve Ability to verbalize feelings will improve Ability to disclose and discuss suicidal ideas Ability to demonstrate self-control will improve Ability to identify and develop effective coping behaviors will improve Ability to maintain clinical measurements within normal limits will improve Compliance with prescribed medications will improve Ability to identify triggers associated with substance abuse/mental health issues will improve     Medication Management: Evaluate patient's response, side effects, and tolerance of medication regimen.  Therapeutic Interventions: 1 to 1 sessions, Unit Group sessions and Medication administration.  Evaluation of Outcomes: Adequate for Discharge   RN Treatment Plan for Primary Diagnosis: MDD (major depressive disorder), recurrent severe, without psychosis (HCC) Long Term Goal(s): Knowledge of disease and therapeutic regimen to maintain health will improve  Short Term Goals: Ability to remain free from injury will improve, Ability to verbalize frustration and anger appropriately will improve, Ability to demonstrate self-control, Ability to participate in decision making will improve, Ability to verbalize feelings will improve, Ability to disclose and discuss suicidal ideas, Ability to identify and develop  effective coping behaviors will improve, and Compliance with prescribed medications will improve  Medication Management: RN will administer medications as ordered by provider, will assess and evaluate patient's response and provide education to patient for prescribed medication. RN will report any adverse and/or side effects to prescribing provider.  Therapeutic Interventions: 1 on 1 counseling sessions, Psychoeducation, Medication administration, Evaluate responses to treatment, Monitor vital signs and CBGs as ordered, Perform/monitor CIWA, COWS, AIMS and Fall Risk screenings as ordered, Perform wound care treatments as ordered.  Evaluation of Outcomes: Adequate for Discharge   LCSW Treatment Plan for Primary Diagnosis: MDD (major depressive disorder), recurrent severe, without psychosis (HCC) Long Term Goal(s): Safe transition to appropriate next level of care at discharge, Engage patient in therapeutic group addressing interpersonal concerns.  Short Term Goals: Engage patient in aftercare planning with referrals and resources, Increase social support, Increase ability to appropriately verbalize feelings, Increase emotional regulation, Facilitate acceptance of mental health diagnosis and concerns, Facilitate patient progression through stages of change regarding substance use diagnoses and concerns, Identify triggers associated with mental health/substance abuse issues, and Increase skills for wellness and recovery  Therapeutic Interventions: Assess for all discharge needs, 1 to 1 time with Social worker, Explore available resources and support systems, Assess for adequacy in community support network, Educate  family and significant other(s) on suicide prevention, Complete Psychosocial Assessment, Interpersonal group therapy.  Evaluation of Outcomes: Adequate for Discharge   Progress in Treatment: Attending groups: Yes. Participating in groups: Yes. Taking medication as prescribed:  Yes. Toleration medication: Yes. Family/Significant other contact made: Yes, individual(s) contacted:  [name] mom [phone]  Patient understands diagnosis: Yes. Discussing patient identified problems/goals with staff: Yes. Medical problems stabilized or resolved: Yes. Denies suicidal/homicidal ideation: Yes. Issues/concerns per patient self-inventory: No.  New problem(s) identified: No, Describe:  none reported   New Short Term/Long Term Goal(s):   medication stabilization, elimination of SI thoughts, development of comprehensive mental wellness plan.    Patient Goals:  Pt continues to work on initial tx team goals.  Discharge Plan or Barriers: Patient going to [facility] for ongoing rehab  Reason for Continuation of Hospitalization: Anxiety Depression Medication stabilization Withdrawal symptoms  Estimated Length of Stay: 1-2 days  Last 3 [place] Suicide Severity Risk Score: Flowsheet Row Admission (Current) [facility] from 04/28/2022 in [name] ED from 04/17/2022 in [facility]  C-SSRS RISK CATEGORY High Risk No Risk No Risk       Last [facility] 2/9 Scores:    01/04/2022   12:56 PM 09/12/2021    1:54 PM 04/30/2018   11:46 AM  Depression screen PHQ 2/9  Decreased Interest 3 2 1   Down, Depressed, Hopeless 3 3 1   PHQ - 2 Score 6 5 2   Altered sleeping 3 3 2   Tired, decreased energy 2 3 2   Change in appetite 3 2 0  Feeling bad or failure about yourself  3 3 0  Trouble concentrating 3 3 1   Moving slowly or fidgety/restless 1 0 0  Suicidal thoughts 2 1 0  PHQ-9 Score 23 20 7   Difficult doing work/chores Very difficult  Somewhat difficult    Scribe for Treatment Team: Beatris Si, LCSW 05/05/2022 4:16 PM
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
## 2022-05-05 NOTE — Plan of Care (Signed)
Nurse discussed depression, anxiety and coping skills with patient.  

## 2022-05-06 MED ORDER — FLUOXETINE HCL 20 MG PO CAPS: 20.0000 mg | ORAL_CAPSULE | Freq: Every day | ORAL | 0 refills | Status: AC

## 2022-05-06 MED ORDER — PROPRANOLOL HCL 20 MG PO TABS: 20.0000 mg | ORAL_TABLET | Freq: Two times a day (BID) | ORAL | 0 refills | Status: AC

## 2022-05-06 MED ORDER — HYDROXYZINE HCL 25 MG PO TABS
25.0000 mg | ORAL_TABLET | Freq: Three times a day (TID) | ORAL | 0 refills | Status: AC | PRN
Start: 2022-05-06 — End: ?

## 2022-05-06 MED ORDER — NICOTINE POLACRILEX 2 MG MT GUM: 2.0000 mg | CHEWING_GUM | OROMUCOSAL | 0 refills | Status: AC | PRN

## 2022-05-06 MED ORDER — LAMOTRIGINE 25 MG PO TABS: 25.0000 mg | ORAL_TABLET | Freq: Every day | ORAL | 0 refills | Status: AC

## 2022-05-06 MED ORDER — QUETIAPINE FUMARATE 25 MG PO TABS: 25.0000 mg | ORAL_TABLET | Freq: Every day | ORAL | 0 refills | Status: AC

## 2022-05-06 MED ORDER — PROPRANOLOL HCL 20 MG PO TABS
20.0000 mg | ORAL_TABLET | Freq: Two times a day (BID) | ORAL | Status: DC
  Filled 2022-05-06 (×2): qty 1

## 2022-05-06 NOTE — Progress Notes (Signed)
Patient ID: Jenny Clark, female   DOB: 09-24-2000, 22 y.o.   MRN: [id number] Order received for patients discharge. Patient denies any SI HI or AV Hallucinations. Verbalized understanding of follow up care plan and states '' I do think that [facility] will help me. I have to do this for my daughter. Marland Kitchen  Pt has been bright and interactive on the unit. Compliant with am medications. All belongings returned. AVS reviewed as well as crisis services. Pt given bus ticket from [facility]. Rx given for medications.No signs of acute decompensation. Pt escorted from unit to lobby to the care of mother.
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
## 2022-05-06 NOTE — [facility] Suicide Risk Assessment (Signed)
[facility] INPATIENT:  Family/Significant Other Suicide Prevention Education  Suicide Prevention Education:  Patient Discharged to Other Healthcare Facility:  Suicide Prevention Education Not Provided: {PT. DISCHARGED TO OTHER HEALTHCARE FACILITY:SUICIDE PREVENTION EDUCATION NOT PROVIDED (CHL):  The patient is discharging to another healthcare facility for continuation of treatment.  The patient's medical information, including suicide ideations and risk factors, are a part of the medical information shared with the receiving healthcare facility.  Dorma Altman S Roshard Rezabek 05/06/2022, 7:53 AM

## 2022-05-06 NOTE — Progress Notes (Signed)
  [facility] Adult Case Management Discharge Plan :  Will you be returning to the same living situation after discharge:  No. Pt will be going directly to [facility] [place]  At discharge, do you have transportation home?: Yes,  Mother Do you have the ability to pay for your medications: Yes,  Insured  Release of information consent forms completed and in the chart;  Patient's signature needed at discharge.  Patient to Follow up at:   Next level of care provider has access to [facility] Link:yes  Safety Planning and Suicide Prevention discussed: Yes,  with mom      Has patient been referred to the Quitline?: Patient refused referral  Patient has been referred for addiction treatment: Yes Patient to continue working towards treatment goals after discharge. Patient no longer meets criteria for inpatient criteria per attending physician. Continue taking medications as prescribed, nursing to provide instructions at discharge. Follow up with all scheduled appointments.   Sherrel Ploch S [place] Beckers, LCSW 05/06/2022, 7:47 AM

## 2022-05-10 ENCOUNTER — Other Ambulatory Visit (HOSPITAL_COMMUNITY): Payer: Self-pay | Admitting: Psychiatry

## 2022-05-10 DIAGNOSIS — F4310 Post-traumatic stress disorder, unspecified: Secondary | ICD-10-CM

## 2022-05-31 ENCOUNTER — Other Ambulatory Visit (HOSPITAL_COMMUNITY): Payer: Self-pay | Admitting: Psychiatry

## 2022-05-31 DIAGNOSIS — F603 Borderline personality disorder: Secondary | ICD-10-CM

## 2023-10-05 IMAGING — DX DG CHEST 1V
1 series · 1 of 1 positions shown · non-contrast
Comparison: 11/14/2014

CLINICAL DATA: Shortness of breath, cough, and congestion.

EXAM:
CHEST  1 VIEW

[chest ap]
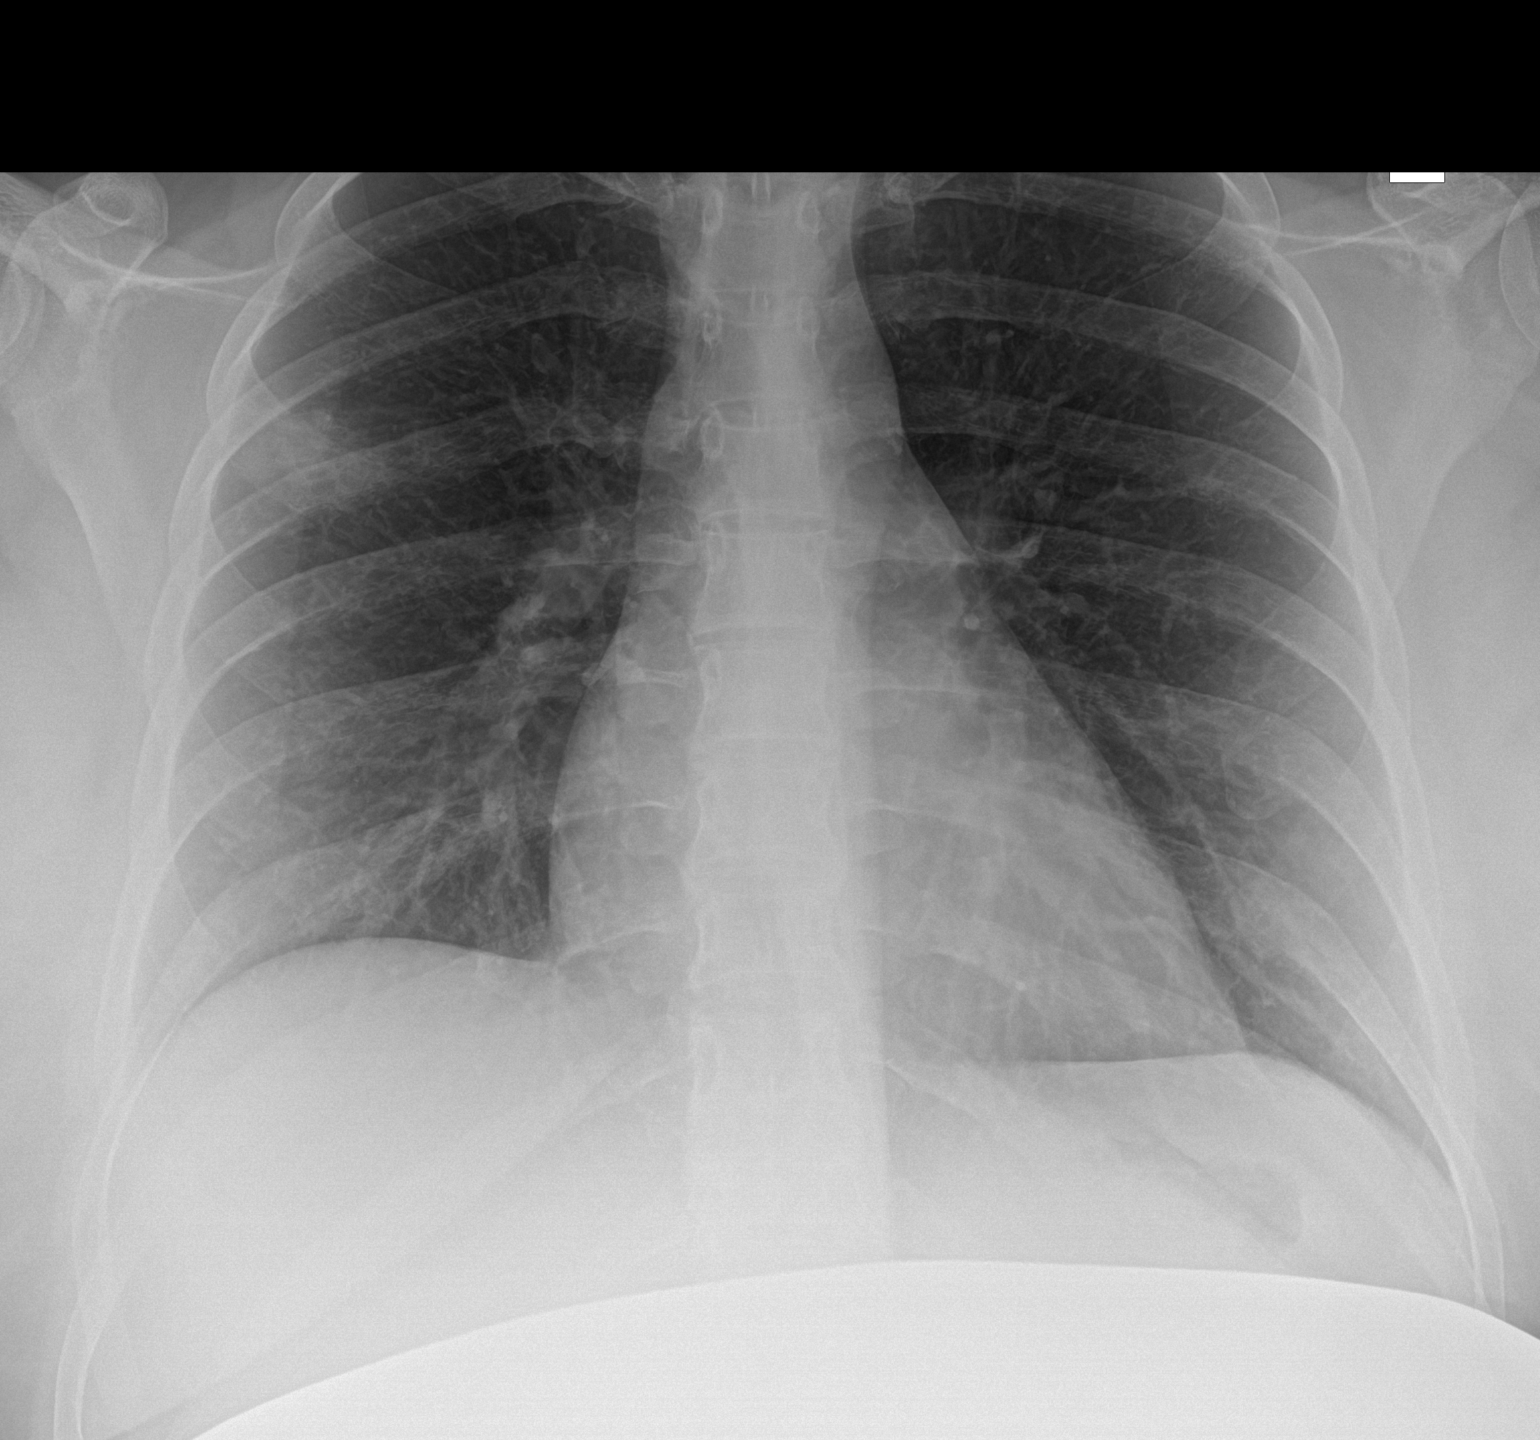

[1 of 1 positions shown; findings below may reference images not displayed]

FINDINGS: The cardiomediastinal silhouette is within normal limits.
Ill-defined densities are present in the right mid lung and possibly
in both lower lungs. The right mid lung density appears partly
nodular. No sizable pleural effusion or pneumothorax is identified.
No acute osseous abnormality is seen.
IMPRESSION: Ill-defined right mid lung and possibly bilateral lower lung
opacities suspicious for infection with this history. Given the
nodular appearance of the right mid lung density, follow-up PA and
lateral chest radiographs are recommended in 3-4 weeks following
treatment to ensure resolution.
